# Patient Record
Sex: Male | Born: 1940 | Race: White | Hispanic: No | Marital: Married | Smoking: Former smoker
Health system: Southern US, Community
[De-identification: ages and names within clinical notes are randomized; demographics above are authoritative.]

## PROBLEM LIST (undated history)

## (undated) DIAGNOSIS — C801 Malignant (primary) neoplasm, unspecified: Secondary | ICD-10-CM

## (undated) DIAGNOSIS — E079 Disorder of thyroid, unspecified: Secondary | ICD-10-CM

## (undated) DIAGNOSIS — M1990 Unspecified osteoarthritis, unspecified site: Secondary | ICD-10-CM

## (undated) DIAGNOSIS — E119 Type 2 diabetes mellitus without complications: Secondary | ICD-10-CM

## (undated) DIAGNOSIS — N289 Disorder of kidney and ureter, unspecified: Secondary | ICD-10-CM

## (undated) DIAGNOSIS — I10 Essential (primary) hypertension: Secondary | ICD-10-CM

## (undated) DIAGNOSIS — I2510 Atherosclerotic heart disease of native coronary artery without angina pectoris: Secondary | ICD-10-CM

## (undated) HISTORY — PX: OTHER SURGICAL HISTORY: SHX169

---

## 2011-12-23 HISTORY — PX: BYPASS GRAFT: SHX909

## 2011-12-23 HISTORY — PX: PACEMAKER INSERTION: SHX728

## 2017-03-15 ENCOUNTER — Emergency Department (HOSPITAL_COMMUNITY): Payer: Medicare Other

## 2017-03-15 ENCOUNTER — Encounter (HOSPITAL_COMMUNITY): Payer: Self-pay

## 2017-03-15 ENCOUNTER — Inpatient Hospital Stay (HOSPITAL_COMMUNITY)
Admission: EM | Admit: 2017-03-15 | Discharge: 2017-03-19 | DRG: 280 | Disposition: A | Discharge status: Home / Self Care | Payer: Medicare Other | Attending: Family Medicine | Admitting: Family Medicine

## 2017-03-15 DIAGNOSIS — I2510 Atherosclerotic heart disease of native coronary artery without angina pectoris: Secondary | ICD-10-CM | POA: Diagnosis present

## 2017-03-15 DIAGNOSIS — E039 Hypothyroidism, unspecified: Secondary | ICD-10-CM | POA: Diagnosis present

## 2017-03-15 DIAGNOSIS — I509 Heart failure, unspecified: Secondary | ICD-10-CM

## 2017-03-15 DIAGNOSIS — C801 Malignant (primary) neoplasm, unspecified: Secondary | ICD-10-CM

## 2017-03-15 DIAGNOSIS — I214 Non-ST elevation (NSTEMI) myocardial infarction: Secondary | ICD-10-CM | POA: Diagnosis present

## 2017-03-15 DIAGNOSIS — I110 Hypertensive heart disease with heart failure: Secondary | ICD-10-CM | POA: Diagnosis present

## 2017-03-15 DIAGNOSIS — E278 Other specified disorders of adrenal gland: Secondary | ICD-10-CM | POA: Diagnosis present

## 2017-03-15 DIAGNOSIS — N179 Acute kidney failure, unspecified: Secondary | ICD-10-CM | POA: Diagnosis present

## 2017-03-15 DIAGNOSIS — E119 Type 2 diabetes mellitus without complications: Secondary | ICD-10-CM | POA: Diagnosis present

## 2017-03-15 DIAGNOSIS — Z79899 Other long term (current) drug therapy: Secondary | ICD-10-CM | POA: Diagnosis not present

## 2017-03-15 DIAGNOSIS — Z8249 Family history of ischemic heart disease and other diseases of the circulatory system: Secondary | ICD-10-CM

## 2017-03-15 DIAGNOSIS — Z905 Acquired absence of kidney: Secondary | ICD-10-CM | POA: Diagnosis not present

## 2017-03-15 DIAGNOSIS — Z85528 Personal history of other malignant neoplasm of kidney: Secondary | ICD-10-CM | POA: Diagnosis not present

## 2017-03-15 DIAGNOSIS — D72829 Elevated white blood cell count, unspecified: Secondary | ICD-10-CM | POA: Diagnosis present

## 2017-03-15 DIAGNOSIS — Z7982 Long term (current) use of aspirin: Secondary | ICD-10-CM | POA: Diagnosis not present

## 2017-03-15 DIAGNOSIS — I5031 Acute diastolic (congestive) heart failure: Secondary | ICD-10-CM | POA: Diagnosis present

## 2017-03-15 DIAGNOSIS — R0600 Dyspnea, unspecified: Secondary | ICD-10-CM | POA: Diagnosis not present

## 2017-03-15 DIAGNOSIS — Z950 Presence of cardiac pacemaker: Secondary | ICD-10-CM

## 2017-03-15 DIAGNOSIS — I160 Hypertensive urgency: Secondary | ICD-10-CM | POA: Diagnosis present

## 2017-03-15 DIAGNOSIS — E785 Hyperlipidemia, unspecified: Secondary | ICD-10-CM | POA: Diagnosis present

## 2017-03-15 DIAGNOSIS — Z87891 Personal history of nicotine dependence: Secondary | ICD-10-CM

## 2017-03-15 DIAGNOSIS — C7801 Secondary malignant neoplasm of right lung: Secondary | ICD-10-CM | POA: Diagnosis present

## 2017-03-15 DIAGNOSIS — R911 Solitary pulmonary nodule: Secondary | ICD-10-CM | POA: Diagnosis not present

## 2017-03-15 DIAGNOSIS — Z7902 Long term (current) use of antithrombotics/antiplatelets: Secondary | ICD-10-CM

## 2017-03-15 DIAGNOSIS — R0602 Shortness of breath: Secondary | ICD-10-CM | POA: Diagnosis present

## 2017-03-15 DIAGNOSIS — Z951 Presence of aortocoronary bypass graft: Secondary | ICD-10-CM | POA: Diagnosis not present

## 2017-03-15 DIAGNOSIS — G4733 Obstructive sleep apnea (adult) (pediatric): Secondary | ICD-10-CM | POA: Diagnosis present

## 2017-03-15 DIAGNOSIS — Z794 Long term (current) use of insulin: Secondary | ICD-10-CM

## 2017-03-15 DIAGNOSIS — I252 Old myocardial infarction: Secondary | ICD-10-CM

## 2017-03-15 DIAGNOSIS — I25110 Atherosclerotic heart disease of native coronary artery with unstable angina pectoris: Secondary | ICD-10-CM | POA: Diagnosis present

## 2017-03-15 DIAGNOSIS — Z833 Family history of diabetes mellitus: Secondary | ICD-10-CM

## 2017-03-15 DIAGNOSIS — E279 Disorder of adrenal gland, unspecified: Secondary | ICD-10-CM

## 2017-03-15 DIAGNOSIS — R590 Localized enlarged lymph nodes: Secondary | ICD-10-CM | POA: Diagnosis present

## 2017-03-15 HISTORY — DX: Disorder of thyroid, unspecified: E07.9

## 2017-03-15 HISTORY — DX: Atherosclerotic heart disease of native coronary artery without angina pectoris: I25.10

## 2017-03-15 HISTORY — DX: Type 2 diabetes mellitus without complications: E11.9

## 2017-03-15 HISTORY — DX: Unspecified osteoarthritis, unspecified site: M19.90

## 2017-03-15 HISTORY — DX: Essential (primary) hypertension: I10

## 2017-03-15 HISTORY — DX: Malignant (primary) neoplasm, unspecified: C80.1

## 2017-03-15 HISTORY — DX: Disorder of kidney and ureter, unspecified: N28.9

## 2017-03-15 LAB — CBC WITH DIFFERENTIAL/PLATELET
BASOS PCT: 0 %
Basophils Absolute: 0.1 10*3/uL (ref 0.0–0.1)
EOS ABS: 0.2 10*3/uL (ref 0.0–0.7)
EOS PCT: 1 %
HCT: 48.8 % (ref 39.0–52.0)
HEMOGLOBIN: 16.5 g/dL (ref 13.0–17.0)
LYMPHS PCT: 7 %
Lymphs Abs: 1 10*3/uL (ref 0.7–4.0)
MCH: 28.7 pg (ref 26.0–34.0)
MCHC: 33.8 g/dL (ref 30.0–36.0)
MCV: 85 fL (ref 78.0–100.0)
MONO ABS: 0.8 10*3/uL (ref 0.1–1.0)
MONOS PCT: 5 %
NEUTROS ABS: 13.7 10*3/uL — AB (ref 1.7–7.7)
Neutrophils Relative %: 87 %
Platelets: 121 10*3/uL — ABNORMAL LOW (ref 150–400)
RBC: 5.74 MIL/uL (ref 4.22–5.81)
RDW: 13.9 % (ref 11.5–15.5)
WBC: 15.8 10*3/uL — ABNORMAL HIGH (ref 4.0–10.5)

## 2017-03-15 LAB — BASIC METABOLIC PANEL
Anion gap: 11 (ref 5–15)
BUN: 12 mg/dL (ref 6–20)
CALCIUM: 9.3 mg/dL (ref 8.9–10.3)
CHLORIDE: 104 mmol/L (ref 101–111)
CO2: 25 mmol/L (ref 22–32)
CREATININE: 1.22 mg/dL (ref 0.61–1.24)
GFR calc non Af Amer: 57 mL/min — ABNORMAL LOW (ref >60)
Glucose, Bld: 259 mg/dL — ABNORMAL HIGH (ref 65–99)
Potassium: 4 mmol/L (ref 3.5–5.1)
SODIUM: 140 mmol/L (ref 135–145)

## 2017-03-15 LAB — TSH: TSH: 0.77 u[IU]/mL (ref 0.350–4.500)

## 2017-03-15 LAB — GLUCOSE, CAPILLARY
GLUCOSE-CAPILLARY: 407 mg/dL — AB (ref 65–99)
Glucose-Capillary: 324 mg/dL — ABNORMAL HIGH (ref 65–99)

## 2017-03-15 LAB — LIPID PANEL
CHOLESTEROL: 147 mg/dL (ref 0–200)
HDL: 42 mg/dL (ref >40)
LDL Cholesterol: 86 mg/dL (ref 0–99)
Total CHOL/HDL Ratio: 3.5 RATIO
Triglycerides: 93 mg/dL (ref <150)
VLDL: 19 mg/dL (ref 0–40)

## 2017-03-15 LAB — TROPONIN I
Troponin I: 0.78 ng/mL (ref <0.03)
Troponin I: 0.89 ng/mL (ref <0.03)
Troponin I: 0.76 ng/mL (ref <0.03)

## 2017-03-15 LAB — BRAIN NATRIURETIC PEPTIDE: B NATRIURETIC PEPTIDE 5: 677.2 pg/mL — AB (ref 0.0–100.0)

## 2017-03-15 MED ORDER — GI COCKTAIL ~~LOC~~: 30.0000 mL | Freq: Four times a day (QID) | ORAL | Status: DC | PRN

## 2017-03-15 MED ORDER — FUROSEMIDE 10 MG/ML IJ SOLN
20.0000 mg | Freq: Two times a day (BID) | INTRAMUSCULAR | Status: DC
  Administered 2017-03-15 – 2017-03-17 (×4): 20 mg via INTRAVENOUS
  Filled 2017-03-15 (×5): qty 2

## 2017-03-15 MED ORDER — ONDANSETRON HCL 4 MG/2ML IJ SOLN: 4.0000 mg | Freq: Four times a day (QID) | INTRAMUSCULAR | Status: DC | PRN

## 2017-03-15 MED ORDER — CARVEDILOL 25 MG PO TABS
25.0000 mg | ORAL_TABLET | Freq: Two times a day (BID) | ORAL | Status: DC
  Administered 2017-03-15 – 2017-03-19 (×8): 25 mg via ORAL
  Filled 2017-03-15 (×8): qty 1

## 2017-03-15 MED ORDER — CARVEDILOL 25 MG PO TABS: 25.0000 mg | ORAL_TABLET | Freq: Two times a day (BID) | ORAL | Status: DC

## 2017-03-15 MED ORDER — HEPARIN BOLUS VIA INFUSION
4000.0000 [IU] | Freq: Once | INTRAVENOUS | Status: AC
  Administered 2017-03-15: 4000 [IU] via INTRAVENOUS
  Filled 2017-03-15: qty 4000

## 2017-03-15 MED ORDER — IPRATROPIUM BROMIDE 0.02 % IN SOLN
0.5000 mg | Freq: Once | RESPIRATORY_TRACT | Status: AC
  Administered 2017-03-15: 0.5 mg via RESPIRATORY_TRACT
  Filled 2017-03-15: qty 2.5

## 2017-03-15 MED ORDER — HEPARIN BOLUS VIA INFUSION
4000.0000 [IU] | Freq: Once | INTRAVENOUS | Status: DC
  Filled 2017-03-15: qty 4000

## 2017-03-15 MED ORDER — NITROGLYCERIN IN D5W 200-5 MCG/ML-% IV SOLN
0.0000 ug/min | Freq: Once | INTRAVENOUS | Status: AC
Start: 2017-03-15 — End: 2017-03-15
  Administered 2017-03-15: 5 ug/min via INTRAVENOUS
  Filled 2017-03-15: qty 250

## 2017-03-15 MED ORDER — HYDRALAZINE HCL 20 MG/ML IJ SOLN: 5.0000 mg | Freq: Once | INTRAMUSCULAR | Status: DC

## 2017-03-15 MED ORDER — INSULIN ASPART 100 UNIT/ML ~~LOC~~ SOLN
0.0000 [IU] | Freq: Three times a day (TID) | SUBCUTANEOUS | Status: DC
  Administered 2017-03-16: 11 [IU] via SUBCUTANEOUS
  Administered 2017-03-16: 15 [IU] via SUBCUTANEOUS
  Administered 2017-03-17 (×3): 5 [IU] via SUBCUTANEOUS
  Administered 2017-03-18: 3 [IU] via SUBCUTANEOUS
  Administered 2017-03-18: 8 [IU] via SUBCUTANEOUS
  Administered 2017-03-18 – 2017-03-19 (×3): 5 [IU] via SUBCUTANEOUS

## 2017-03-15 MED ORDER — AMLODIPINE BESYLATE 5 MG PO TABS: 10.0000 mg | ORAL_TABLET | Freq: Every day | ORAL | Status: DC

## 2017-03-15 MED ORDER — ALBUTEROL SULFATE (2.5 MG/3ML) 0.083% IN NEBU
5.0000 mg | INHALATION_SOLUTION | Freq: Once | RESPIRATORY_TRACT | Status: AC
  Administered 2017-03-15: 5 mg via RESPIRATORY_TRACT

## 2017-03-15 MED ORDER — HEPARIN (PORCINE) IN NACL 100-0.45 UNIT/ML-% IJ SOLN
1050.0000 [IU]/h | INTRAMUSCULAR | Status: DC
  Filled 2017-03-15: qty 250

## 2017-03-15 MED ORDER — LEVOTHYROXINE SODIUM 100 MCG PO TABS
100.0000 ug | ORAL_TABLET | Freq: Every day | ORAL | Status: DC
  Administered 2017-03-15 – 2017-03-19 (×5): 100 ug via ORAL
  Filled 2017-03-15 (×6): qty 1

## 2017-03-15 MED ORDER — AMLODIPINE BESYLATE 5 MG PO TABS
10.0000 mg | ORAL_TABLET | Freq: Every day | ORAL | Status: DC
  Administered 2017-03-15 – 2017-03-16 (×2): 10 mg via ORAL
  Filled 2017-03-15 (×2): qty 2

## 2017-03-15 MED ORDER — ASPIRIN EC 81 MG PO TBEC
81.0000 mg | DELAYED_RELEASE_TABLET | Freq: Every day | ORAL | Status: DC
  Administered 2017-03-16 – 2017-03-18 (×3): 81 mg via ORAL
  Filled 2017-03-15 (×4): qty 1

## 2017-03-15 MED ORDER — ALBUTEROL SULFATE (2.5 MG/3ML) 0.083% IN NEBU
INHALATION_SOLUTION | RESPIRATORY_TRACT | Status: AC
  Filled 2017-03-15: qty 6

## 2017-03-15 MED ORDER — ALBUTEROL (5 MG/ML) CONTINUOUS INHALATION SOLN
10.0000 mg/h | INHALATION_SOLUTION | Freq: Once | RESPIRATORY_TRACT | Status: DC
  Filled 2017-03-15: qty 20

## 2017-03-15 MED ORDER — CLONIDINE HCL 0.1 MG PO TABS
0.1000 mg | ORAL_TABLET | Freq: Every day | ORAL | Status: DC
  Administered 2017-03-15 – 2017-03-16 (×2): 0.1 mg via ORAL
  Filled 2017-03-15 (×2): qty 1

## 2017-03-15 MED ORDER — ACETAMINOPHEN 325 MG PO TABS: 650.0000 mg | ORAL_TABLET | ORAL | Status: DC | PRN

## 2017-03-15 MED ORDER — HEPARIN (PORCINE) IN NACL 100-0.45 UNIT/ML-% IJ SOLN
1050.0000 [IU]/h | INTRAMUSCULAR | Status: DC
  Administered 2017-03-15: 1050 [IU]/h via INTRAVENOUS
  Filled 2017-03-15 (×2): qty 250

## 2017-03-15 MED ORDER — INSULIN ASPART 100 UNIT/ML ~~LOC~~ SOLN
0.0000 [IU] | SUBCUTANEOUS | Status: DC
  Administered 2017-03-15: 15 [IU] via SUBCUTANEOUS

## 2017-03-15 MED ORDER — SODIUM CHLORIDE 0.9 % IV SOLN
INTRAVENOUS | Status: DC
  Administered 2017-03-15: 20 mL/h via INTRAVENOUS
  Administered 2017-03-16: 14:00:00 via INTRAVENOUS

## 2017-03-15 MED ORDER — FUROSEMIDE 10 MG/ML IJ SOLN
40.0000 mg | Freq: Once | INTRAMUSCULAR | Status: AC
Start: 2017-03-15 — End: 2017-03-15
  Administered 2017-03-15: 40 mg via INTRAVENOUS
  Filled 2017-03-15: qty 4

## 2017-03-15 MED ORDER — METHYLPREDNISOLONE SODIUM SUCC 125 MG IJ SOLR
125.0000 mg | Freq: Once | INTRAMUSCULAR | Status: AC
  Administered 2017-03-15: 125 mg via INTRAVENOUS
  Filled 2017-03-15: qty 2

## 2017-03-15 MED ORDER — HEPARIN SODIUM (PORCINE) 5000 UNIT/ML IJ SOLN: 5000.0000 [IU] | Freq: Three times a day (TID) | INTRAMUSCULAR | Status: DC

## 2017-03-15 MED ORDER — INSULIN ASPART 100 UNIT/ML ~~LOC~~ SOLN
0.0000 [IU] | Freq: Three times a day (TID) | SUBCUTANEOUS | Status: DC
  Administered 2017-03-15: 11 [IU] via SUBCUTANEOUS

## 2017-03-15 MED ORDER — IOPAMIDOL (ISOVUE-370) INJECTION 76%
INTRAVENOUS | Status: AC
  Filled 2017-03-15: qty 100

## 2017-03-15 NOTE — Progress Notes (Signed)
Attending Brief Admission Note  Patient seen and examined around 3:30pm. Briefly, 76 y.o. male with history of CAD with prior CABGx4, hypertension, type 2 diabetes, OSA, hypothyroidism, nephrectomy for renal cell carcinoma presenting with acute onset of shortness of breath occurring this morning.  Labs significant for troponin elevation to 0.78. No prior EKG for comparison in our system, though does have QRS widening and some questionable ST changes. These were discussed by [facility] residents with STEMI team, who does not believe this to be STEMI. At time of my evaluation patient felt back to normal, on nitro drip. CXR with some pulmonary edema, also lung nodule. Exam shows well appearing male in NAD, some crackles in bases bilaterally, otherwise exam unremarkable.  Unclear if this is true ACS vs PE vs new onset CHF. Plan is to trend troponins, start IV heparin, get CTA chest to rule out PE & evaluate pulmonary nodule, also get echo. Dr. Meda Coffee of cardiology has kindly seen patient and agrees with this plan. Cardiology will follow, may end up performing cath this admission. Greatly appreciate cardiology recommendations.   Will cosign resident H&P when it is available.  Chrisandra Netters, MD [place]

## 2017-03-15 NOTE — ED Notes (Signed)
Troponin elevated at 0.78. Dr. Zenia Resides informed. No new verbal orders at this time.

## 2017-03-15 NOTE — ED Provider Notes (Addendum)
[facility] DEPT Provider Note   CSN: [id number] Arrival date & time: 03/15/17  2330     History   Chief Complaint Chief Complaint  Patient presents with  . Shortness of Breath    HPI Phillip Patel is a 76 y.o. male.  76 year old male with history of coronary artery disease presents with acute onset of shortness of breath with associated wheezing when he went to take a shower this morning. Denies any anginal type chest pain. No leg swelling or CHF symptoms. No fever or chills. No vomiting or diarrhea. Has been short of breath for the past several weeks but today was a lot worse. EMS was called and patient found to have increased blood pressure which he treated with his home meds. Patient placed on oxygen by EMS. Was transported here for treatment.      Past Medical History:  Diagnosis Date  . Arthritis   . Cancer ([place])    Kidney   . Coronary artery disease   . Diabetes mellitus without complication ([place])   . Hypertension   . Renal disorder    1 kidney, other removed for cancer  . Thyroid disease     There are no active problems to display for this patient.   Past Surgical History:  Procedure Laterality Date  . BYPASS GRAFT  2013   quadrupal bypass  . nephrectomy     . PACEMAKER INSERTION  2013       Home Medications    Prior to Admission medications   Not on File    Family History No family history on file.  Social History Social History  Substance Use Topics  . Smoking status: Former Research scientist (life sciences)  . Smokeless tobacco: Never Used  . Alcohol use Yes     Allergies   Patient has no allergy information on record.   Review of Systems Review of Systems  All other systems reviewed and are negative.    Physical Exam Updated Vital Signs BP (!) 233/101   Pulse 89   Temp 98.4 F (36.9 C) (Oral)   Resp (!) 23   Ht 5\' 6"  (1.676 m)   Wt 77.1 kg   SpO2 100% Comment: 2L O2  BMI 27.44 kg/m   Physical Exam  Constitutional: He is oriented to  person, place, and time. He appears well-developed and well-nourished.  Non-toxic appearance. No distress.  HENT:  Head: Normocephalic and atraumatic.  Eyes: Conjunctivae, EOM and lids are normal. Pupils are equal, round, and reactive to light.  Neck: Normal range of motion. Neck supple. No tracheal deviation present. No thyroid mass present.  Cardiovascular: Normal rate, regular rhythm and normal heart sounds.  Exam reveals no gallop.   No murmur heard. Pulmonary/Chest: Effort normal. No stridor. No respiratory distress. He has decreased breath sounds in the right lower field and the left lower field. He has wheezes in the right lower field and the left lower field. He has no rhonchi. He has no rales.  Abdominal: Soft. Normal appearance and bowel sounds are normal. He exhibits no distension. There is no tenderness. There is no rebound and no CVA tenderness.  Musculoskeletal: Normal range of motion. He exhibits no edema or tenderness.  Neurological: He is alert and oriented to person, place, and time. He has normal strength. No cranial nerve deficit or sensory deficit. GCS eye subscore is 4. GCS verbal subscore is 5. GCS motor subscore is 6.  Skin: Skin is warm and dry. No abrasion and no rash noted.  Psychiatric:  He has a normal mood and affect. His speech is normal and behavior is normal.  Nursing note and vitals reviewed.    ED Treatments / Results  Labs (all labs ordered are listed, but only abnormal results are displayed) Labs Reviewed  CBC WITH DIFFERENTIAL/PLATELET  BASIC METABOLIC PANEL  BRAIN NATRIURETIC PEPTIDE  TROPONIN I    EKG  EKG Interpretation  Date/Time:  Sunday March 15 2017 10:11:14 EDT Ventricular Rate:  88 PR Interval:    QRS Duration: 138 QT Interval:  401 QTC Calculation: 486 R Axis:   131 Text Interpretation:  Sinus rhythm Nonspecific intraventricular conduction delay Anterolateral infarct, acute (LAD) Confirmed by Carliss Porcaro  MD, Misty Rago ([id number]) on 03/15/2017  10:23:08 AM       Radiology No results found.  Procedures Procedures (including critical care time)  Medications Ordered in ED Medications  0.9 %  sodium chloride infusion (not administered)     Initial Impression / Assessment and Plan / ED Course  I have reviewed the triage vital signs and the nursing notes.  Pertinent labs & imaging results that were available during my care of the patient were reviewed by me and considered in my medical decision making (see chart for details).    Patient has no anginal type chest pain at this time. Troponin elevation noted. Patient had bronchospasm and was treated IV steroids and beta agonist.  chest x-ray consistent with CHF and patient given Lasix. Blood pressure noted and will start patient on IV nitroglycerin. Discuss with family practice resident who will come to admit the patient  CRITICAL CARE Performed by: Leota Jacobsen Total critical care time: 50 minutes Critical care time was exclusive of separately billable procedures and treating other patients. Critical care was necessary to treat or prevent imminent or life-threatening deterioration. Critical care was time spent personally by me on the following activities: development of treatment plan with patient and/or surrogate as well as nursing, discussions with consultants, evaluation of patient's response to treatment, examination of patient, obtaining history from patient or surrogate, ordering and performing treatments and interventions, ordering and review of laboratory studies, ordering and review of radiographic studies, pulse oximetry and re-evaluation of patient's condition.   Final Clinical Impressions(s) / ED Diagnoses   Final diagnoses:  SOB (shortness of breath)    New Prescriptions New Prescriptions   No medications on file     Lacretia Leigh, MD 03/15/17 1306    Lacretia Leigh, MD 03/15/17 1308

## 2017-03-15 NOTE — Consult Note (Addendum)
CARDIOLOGY CONSULT NOTE   Patient ID: Phillip Patel MRN: [id number], DOB/AGE: [birth year]   Admit date: 03/15/2017 Date of Consult: 03/15/2017  Primary Physician: Pcp Not In System Primary Cardiologist: A cardiologist at [facility], [place] Referring physician: Chrisandra Patel  Reason for consult:  SOB, troponin elevation  Problem List  Past Medical History:  Diagnosis Date  . Arthritis   . Cancer ([place])    Kidney   . Coronary artery disease   . Diabetes mellitus without complication ([place])   . Hypertension   . Renal disorder    1 kidney, other removed for cancer  . Thyroid disease     Past Surgical History:  Procedure Laterality Date  . BYPASS GRAFT  2013   quadrupal bypass  . nephrectomy     . PACEMAKER INSERTION  2013     Allergies  No Known Allergies  HPI   Phillip Patel is a 76 y.o. male presenting with an acute onset shortness of breath.  The patient has past medical history significant for CABG x4 2013, pacemaker (st jude), L kidney removal RCC 2007, HTN, diabetes, HLD, hypothyroidism. He states that he remains fairly active, and goes to the gym in his hospital 3 times a week with no symptoms of chest pain or shortness of breath, and has been compliant with his medications. His blood pressure has been recently poorly controlled and he has seen nephrologist approximately 3 weeks ago.  He is visiting here from [place], he denies any unusual activity, food intake, drugs or alcohol, however this morning when he walked out of shower he experienced sudden onset shortness of breath and called EMS. He didn't feel any chest pain. He didn't take his morning medications today as he was taken to the ER. Upon presentation he got sublingual nitroglycerin that eased up his pain. His blood pressure on presentation was 233/101 and he was started on IV nitroglycerin. He is currently chest pain-free, he also denies shortness of breath. He denies any orthopnea  or proximal nocturnal dyspnea at home. He denies any lower extremity edema. He has been compliant with his medications. He states that last week he had a subjective feeling of fever and chills approximately on Wednesday and he had cough that is now resolved.  Inpatient Medications  . amLODipine  10 mg Oral Daily  . [START ON 03/16/2017] aspirin EC  81 mg Oral Daily  . carvedilol  25 mg Oral BID WC  . cloNIDine  0.1 mg Oral Daily  . heparin  4,000 Units Intravenous Once  . insulin aspart  0-15 Units Subcutaneous TID WC  . iopamidol      . levothyroxine  100 mcg Oral QAC breakfast   . sodium chloride 20 mL/hr (03/15/17 1046)  . heparin     Family History No family history on file.   Social History Social History   Social History  . Marital status: Married    Spouse name: N/A  . Number of children: N/A  . Years of education: N/A   Occupational History  . Not on file.   Social History Main Topics  . Smoking status: Former Research scientist (life sciences)  . Smokeless tobacco: Never Used  . Alcohol use Yes  . Drug use: Unknown  . Sexual activity: Not on file   Other Topics Concern  . Not on file   Social History Narrative  . No narrative on file     Review of Systems  General:  No chills, fever, night sweats  or weight changes.  Cardiovascular:  No chest pain, dyspnea on exertion, edema, orthopnea, palpitations, paroxysmal nocturnal dyspnea. Dermatological: No rash, lesions/masses Respiratory: No cough, dyspnea Urologic: No hematuria, dysuria Abdominal:   No nausea, vomiting, diarrhea, bright red blood per rectum, melena, or hematemesis Neurologic:  No visual changes, wkns, changes in mental status. All other systems reviewed and are otherwise negative except as noted above.  Physical Exam  Blood pressure (!) 171/74, pulse 85, temperature 98.4 F (36.9 C), temperature source Oral, resp. rate 18, height 5\' 6"  (1.676 m), weight 170 lb (77.1 kg), SpO2 95 %.  General: Pleasant, NAD Psych:  Normal affect. Neuro: Alert and oriented X 3. Moves all extremities spontaneously. HEENT: Normal  Neck: Supple without bruits or JVD. Lungs:  Resp regular and unlabored, crackles L>R Heart: RRR no s3, s4, 2/6 systolic murmur. Abdomen: Soft, non-tender, non-distended, BS + x 4.  Extremities: No clubbing, cyanosis or edema. DP/PT/Radials 2+ and equal bilaterally.  Labs  Recent Labs  03/15/17 1034  TROPONINI 0.78*   Lab Results  Component Value Date   WBC 15.8 (H) 03/15/2017   HGB 16.5 03/15/2017   HCT 48.8 03/15/2017   MCV 85.0 03/15/2017   PLT 121 (L) 03/15/2017     Recent Labs Lab 03/15/17 1034  NA 140  K 4.0  CL 104  CO2 25  BUN 12  CREATININE 1.22  CALCIUM 9.3  GLUCOSE 259*   Radiology/Studies  Dg Chest 2 View  Result Date: 03/15/2017 CLINICAL DATA:  Patient with history of bypass surgery. Shortness of breath. EXAM: CHEST  2 VIEW COMPARISON:  None. FINDINGS: Multi lead pacer apparatus overlies the left hemithorax. Patient status post median sternotomy. Aortic atherosclerosis. No large area of pulmonary consolidation. Perihilar interstitial opacities. Biapical pleuroparenchymal thickening. There is a 1.9 cm nodule within the right mid lung. Thoracic spine degenerative changes. IMPRESSION: 1.9 cm nodule within the right mid lung. This needs dedicated evaluation with chest CT in the non acute setting. Cardiac contours upper limits of normal. Perihilar interstitial opacities. A component of pulmonary edema is not excluded. Aortic atherosclerosis. Electronically Signed   By: Lovey Newcomer M.D.   On: 03/15/2017 10:36   Echocardiogram - none  ECG: SR, deep Q waves in the anterolateral leads     ASSESSMENT AND PLAN  Patient Active Problem List   Diagnosis Date Noted  . Shortness of breath 03/15/2017  . Non-ST elevation (NSTEMI) myocardial infarction ([place]) 03/15/2017  . Hypertensive urgency, malignant 03/15/2017  . Hx of CABG 03/15/2017  . Lung nodule 03/15/2017    The patient presents with SOB, elevated troponin 0.7, ECG shows SR, deep Q waves in the anterolateral leads, consistent with a prior  he is currently chest pain free, the differential includes NSTEMI, pulmonary embolism, he also has evidence of CHF and a pulmonary nodule on chest CT.  Plan: - discussed with STEMI team, they believe that ECG shows an old anterior MI - we obtained ECG from [facility] in [facility], that shows SR, deep Q waves in the anterolateral leads, but more narrow QRS 94 ms - CT chest to rule out PE and further characterize lung nodule  - restart home BP meds - carvedilol 25 mg po BID and clonidine 0.1 mg po daily, amlodipine was added, I agree - cycle troponin, decide about potential cath based on the above results and troponin trend - order echocardiogram - start low dose lasix 20 mg iv BID, follow crea closely given h/o nephrectomy  - start iv  heparin  Signed, Ena Dawley, MD, [facility] 03/15/2017, 4:03 PM

## 2017-03-15 NOTE — ED Triage Notes (Addendum)
Patient has been experiencing SOB the past few weeks but felt it was worse this AM with exertion. Hy. Of quadrupl bypass and pacemaker placement. Systolic was in the 561'B with EMS. No headaches, vision changes, or epitaxis. No CP, or N/V. Small expiratory wheezes noted bilaterally. Currently on 2L O2 and sats 100%.

## 2017-03-15 NOTE — H&P (Signed)
[facility] Admission History and Physical Service Pager: [phone]  Patient name: Phillip Patel Medical record number: [id number] Date of birth: 01-22-1941 Age: 76 y.o. Gender: male  Primary Care Provider: Pcp Not In System Consultants: Cardiology Code Status: Full code  Chief Complaint: shortness of breath  Assessment and Plan: [name] is a 76 y.o. male presenting with acute shortness of breath without chest pain, lower extremity swelling or pain. PMH is significant for CABG  2013, pacemaker (st jude), L kidney removal RCC 2007, HTN, diabetes, HLD, hypothyroidism, sleep apnea   Acute SOB: Acute shortness of breath without associated chest pain, although noted that shortness of breath was worse with exertion and did have some diaphoresis the night prior. Given history of diabetes, this could be an atypical presentation of ACS. Pt has significant CAD history with CABG in 2013 and reportedly felt similar to this presentation. EKG showed LVH and first troponin elevated to 0.78. Will need ACS rule out. No history of heart failure to date, but presented with BNP elevated to 677 and has bibasilar crackles on exam with no LE edema, highly suggestive of acute heart failure. Given lasix 40mg  IV lasix.  Denies fevers, chills or productive sputum and has CXR showing no infiltrates, making PNA unlikely. CXR did show 1.9 cm nodule within the right mid lung that needs CT to further identify. No LE edema, erythema or palpable cords on exam, but does have a wells score of 3 based on PE being the most likely diagnosis,therefore will get CTA.  Discussed case and EKG with on call Cardiologist Dr. Burt Knack who felt this was unlikely to be acute ACS. In ED was reportedly wheezing and was given solumedrol and duonebs with some relief and started on nitro drip for SOB and elevated blood pressure. Did have leukocytosis to 15 in ED with no additional s/sx of infection.  - Admit to  inpatient with telemetry under attending [facility] - Cardiology consulted;appreciate recommendations - Vitals per floor protocol - Continuous cardiac monitoring and pulse ox - Start heparin drip per pharmacy for possible NSTEMI and/or PE - CTA chest to evaluate for PE and 1.9 cm nodule - Lasix 20mg  IV BID - f/u echo - Trend troponins - Repeat EKG in AM - Titrate oxygen to greater than 92% - s/p ASA 325mg  ASA, cont 81mg  3/25 - AM CBC, BMP - Risk strat. Labs A1C, Lipid panel, TSH - duonebs PRN  History of hypertension with hypertensive emergency: Presented with BP to 233/101 and shortness of breath and denied any headaches, changes in vision or chest pain. Did have troponin elevated to 0.78 and BNP of 677. Patient is alert and oriented 3. Takes quinapril (unknown dose), coreg 25mg  BID and clonidine 0.1mg  at home. Took his clonidine last night and quinapril en route to [facility]. Did receive solumedrol in ED for reported bronchospasm and could be contributing to elevated blood pressures.  No focal neurological deficits on exam, making CVA very unlikely. Started on nitro drip for BP control in ED and given lasix 40mg  IV for SOB.  - Continue Coreg 25 mg twice a day - Continue clonidine 0.1 mg daily - Started Norvasc 10 mg daily - Discontinue nitro drip - Cardiology following; appreciate recommendations  History of CAD: CABG 4 vessels in 0962 complicated by bradycardia with [employer] Jude pacemaker (MRI non-compatible) and presented with acute shortness of breath without chest pain and EKG showing deep Q waves in the anterolateral leads consistent with a prior EKG. STEMI team  feels that EKG shows an old anterior MI. Troponin elevated to 0.78. Patient took aspirin 325 mg prior to coming to [facility] and received sublingual nitro x1.  Currently denying CP.  - Cardiology following; appreciate recommendations - f/u repeat EKG - Trending troponins - continuous cardiac monitoring - Continue heparin ggt  Diabetes:  Reports that sugars are usually in the 130s-140s. 259 on BMP upon arrival.  Takes novolin 10U BID.   - mSSI - f/u A1C  HLD:  Per patient, takes a statin. - Pharmacy team currently reconciling medication. Will need to follow up  Hypothyroidism: Takes levothyroxine 115mcg daily.  - cont levothyroxine 1105mcg daily - repeat TSH  History of RCC s/p L kidney removal:  In 2007 had total L nephrectomy for RCC and reportedly did not require any additional treatment.  Does have Cr to 1.22. Unclear what his baseline is.  - continue to monitor BMP  OSA  - CPAP QHS  FEN/GI: heart healthy diet Prophylaxis: heparin ggt  Disposition: admit to inpatient with telemetry under attending [place]. Ruling out ACS/PE and evaluating for likely acute heart failure  History of Present Illness:  Phillip Patel is a 76 y.o. male presenting after episode of acute SOB this morning after getting out of the shower. Denied any associated CP. Did note that SOB was worse with exertion. Has a significant cardiac history of 4-vessel CABG in 2013 and stated that this episode feels similar. Last week had one day of subjective fevers and chills and over the course of the week has had ongoing upper respiratory symptoms.  Denies any HA, changes in vision, productive cough, fevers, chills, palpitations or LE edema, erythema or pain. His wife gave him ASA 325mg  prior to his arrival to [facility] via ambulance.   ED course: Noted to have BP elevated to 233/101 and denied any HA, changes in vision or CP. EKG showing deep Q waves in anterolateral leads consistent with LVH. Troponin elevated to 0.78 and BNP elevated to 677. WBC to 15.8.   Review Of Systems: Per HPI   Review of Systems  All other systems reviewed and are negative.   Patient Active Problem List   Diagnosis Date Noted  . Shortness of breath 03/15/2017  . Non-ST elevation (NSTEMI) myocardial infarction ([place]) 03/15/2017  . Hypertensive urgency, malignant 03/15/2017  . Hx  of CABG 03/15/2017  . Lung nodule 03/15/2017    Past Medical History: Past Medical History:  Diagnosis Date  . Arthritis   . Cancer ([place])    Kidney   . Coronary artery disease   . Diabetes mellitus without complication ([place])   . Hypertension   . Renal disorder    1 kidney, other removed for cancer  . Thyroid disease     Past Surgical History: Past Surgical History:  Procedure Laterality Date  . BYPASS GRAFT  2013   quadrupal bypass  . nephrectomy     . PACEMAKER INSERTION  2013    Social History: Social History  Substance Use Topics  . Smoking status: Former Research scientist (life sciences)  . Smokeless tobacco: Never Used  . Alcohol use Yes    Family History: No family history on file. Mother: HTN, T2DM  Allergies and Medications: No Known Allergies No current facility-administered medications on file prior to encounter.    No current outpatient prescriptions on file prior to encounter.    Objective: BP (!) 181/82 (BP Location: Right Arm)   Pulse 87   Temp 98.3 F (36.8 C) (Oral)   Resp 18  Ht 5\' 6"  (1.676 m)   Wt 166 lb (75.3 kg) Comment: scale a  SpO2 94%   BMI 26.79 kg/m  Exam: General: 76 year old male sitting up in bed appearing comfortable with nasal cannula in place on 1 L Eyes: EOMI, PERRL, non-injected ENTM: Oropharynx clear,MMM Neck: supple, no LAD Cardiovascular: RRR, no MRG, 2+ palpable radial pulses Respiratory: Gratis in place and NWOB, mild bibasilar crackles, no wheezing or rhonchi,  Gastrointestinal: obese abdomen with oblique scar on LUQ, soft NTND and no palpable masses. 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  Kerrin Mo, MD, PGY-2 [facility]

## 2017-03-15 NOTE — Progress Notes (Signed)
Patient's CBG 407. MD Paged. Order to give 15 units of novolog. Will continue to monitor patient.

## 2017-03-15 NOTE — Progress Notes (Signed)
Paged MD regarding nitro drip. Dr. Rosalyn Gess gave verbal order to stop Nitro.

## 2017-03-15 NOTE — Progress Notes (Signed)
ANTICOAGULATION CONSULT NOTE - Initial Consult  Pharmacy Consult for Heparin Indication: chest pain/ACS  No Known Allergies  Patient Measurements: Height: 5\' 6"  (167.6 cm) Weight: 170 lb (77.1 kg) IBW/kg (Calculated) : 63.8  Vital Signs: Temp: 98.4 F (36.9 C) (03/25 1004) Temp Source: Oral (03/25 1004) BP: 171/74 (03/25 1430) Pulse Rate: 85 (03/25 1430)  Labs:  Recent Labs  03/15/17 1034  HGB 16.5  HCT 48.8  PLT 121*  CREATININE 1.22  TROPONINI 0.78*    Estimated Creatinine Clearance: 51.9 mL/min (by C-G formula based on SCr of 1.22 mg/dL).   Medical History: Past Medical History:  Diagnosis Date  . Arthritis   . Cancer ([place])    Kidney   . Coronary artery disease   . Diabetes mellitus without complication ([place])   . Hypertension   . Renal disorder    1 kidney, other removed for cancer  . Thyroid disease     Medications:  No anticoagulants pta  Assessment: 75yom with elevated troponin to begin heparin. Baseline labs wnl.  Goal of Therapy:  Heparin level 0.3-0.7 units/ml Monitor platelets by anticoagulation protocol: Yes   Plan:  1) Heparin bolus 4000 units x 1 2) Heparin drip at 1050 units/hr 3) 8 hour heparin level 4) Daily heparin level and CBC  Deboraha Sprang 03/15/2017,3:03 PM

## 2017-03-16 ENCOUNTER — Inpatient Hospital Stay (HOSPITAL_COMMUNITY)
Admission: EM | Disposition: A | Discharge status: Home / Self Care | Payer: Self-pay | Source: Home / Self Care | Attending: Family Medicine

## 2017-03-16 ENCOUNTER — Inpatient Hospital Stay (HOSPITAL_COMMUNITY): Payer: Medicare Other

## 2017-03-16 DIAGNOSIS — I25110 Atherosclerotic heart disease of native coronary artery with unstable angina pectoris: Secondary | ICD-10-CM | POA: Diagnosis present

## 2017-03-16 DIAGNOSIS — R0600 Dyspnea, unspecified: Secondary | ICD-10-CM

## 2017-03-16 DIAGNOSIS — I509 Heart failure, unspecified: Secondary | ICD-10-CM

## 2017-03-16 DIAGNOSIS — I160 Hypertensive urgency: Secondary | ICD-10-CM

## 2017-03-16 HISTORY — PX: LEFT HEART CATH AND CORS/GRAFTS ANGIOGRAPHY: CATH118250

## 2017-03-16 LAB — TROPONIN I
Troponin I: 0.73 ng/mL (ref <0.03)
Troponin I: 0.69 ng/mL (ref <0.03)

## 2017-03-16 LAB — CBC
HCT: 44.3 % (ref 39.0–52.0)
Hemoglobin: 14.9 g/dL (ref 13.0–17.0)
MCH: 28.3 pg (ref 26.0–34.0)
MCHC: 33.6 g/dL (ref 30.0–36.0)
MCV: 84.1 fL (ref 78.0–100.0)
Platelets: 137 10*3/uL — ABNORMAL LOW (ref 150–400)
RBC: 5.27 MIL/uL (ref 4.22–5.81)
RDW: 13.8 % (ref 11.5–15.5)
WBC: 18.1 10*3/uL — AB (ref 4.0–10.5)

## 2017-03-16 LAB — COMPREHENSIVE METABOLIC PANEL
ALK PHOS: 74 U/L (ref 38–126)
ALT: 18 U/L (ref 17–63)
ANION GAP: 11 (ref 5–15)
AST: 19 U/L (ref 15–41)
Albumin: 3.6 g/dL (ref 3.5–5.0)
BUN: 26 mg/dL — ABNORMAL HIGH (ref 6–20)
CALCIUM: 9.1 mg/dL (ref 8.9–10.3)
CO2: 26 mmol/L (ref 22–32)
Chloride: 101 mmol/L (ref 101–111)
Creatinine, Ser: 1.5 mg/dL — ABNORMAL HIGH (ref 0.61–1.24)
GFR calc Af Amer: 51 mL/min — ABNORMAL LOW (ref >60)
GFR calc non Af Amer: 44 mL/min — ABNORMAL LOW (ref >60)
GLUCOSE: 332 mg/dL — AB (ref 65–99)
Potassium: 3.6 mmol/L (ref 3.5–5.1)
Sodium: 138 mmol/L (ref 135–145)
Total Bilirubin: 1.4 mg/dL — ABNORMAL HIGH (ref 0.3–1.2)
Total Protein: 6.7 g/dL (ref 6.5–8.1)

## 2017-03-16 LAB — ECHOCARDIOGRAM COMPLETE
AVLVOTPG: 3 mmHg
EWDT: 239 ms
FS: 23 % — AB (ref 28–44)
Height: 66 in
IV/PV OW: 0.75
LA diam index: 1.93 cm/m2
LA vol A4C: 50.3 ml
LA vol index: 30 mL/m2
LASIZE: 36 mm
LAVOL: 55.9 mL
LDCA: 2.54 cm2
LEFT ATRIUM END SYS DIAM: 36 mm
LV PW d: 12 mm — AB (ref 0.6–1.1)
LV TDI E'LATERAL: 7.07
LV TDI E'MEDIAL: 4.13
LV e' LATERAL: 7.07 cm/s
LVOT SV: 48 mL
LVOT VTI: 18.8 cm
LVOTD: 18 mm
LVOTPV: 84.9 cm/s
MV Dec: 239
MV pk E vel: 1.1 m/s
RV LATERAL S' VELOCITY: 9.36 cm/s
RV TAPSE: 14.3 mm
WEIGHTICAEL: 2596.8 [oz_av]

## 2017-03-16 LAB — HEMOGLOBIN A1C
Hgb A1c MFr Bld: 9.2 % — ABNORMAL HIGH (ref 4.8–5.6)
Mean Plasma Glucose: 217 mg/dL

## 2017-03-16 LAB — GLUCOSE, CAPILLARY
GLUCOSE-CAPILLARY: 178 mg/dL — AB (ref 65–99)
Glucose-Capillary: 320 mg/dL — ABNORMAL HIGH (ref 65–99)
Glucose-Capillary: 352 mg/dL — ABNORMAL HIGH (ref 65–99)
Glucose-Capillary: 203 mg/dL — ABNORMAL HIGH (ref 65–99)

## 2017-03-16 LAB — HEPARIN LEVEL (UNFRACTIONATED)
Heparin Unfractionated: 0.38 IU/mL (ref 0.30–0.70)
Heparin Unfractionated: 0.56 IU/mL (ref 0.30–0.70)

## 2017-03-16 LAB — PROTIME-INR
INR: 1.1
PROTHROMBIN TIME: 14.3 s (ref 11.4–15.2)

## 2017-03-16 LAB — D-DIMER, QUANTITATIVE (NOT AT ARMC): D DIMER QUANT: 0.41 ug{FEU}/mL (ref 0.00–0.50)

## 2017-03-16 SURGERY — LEFT HEART CATH AND CORS/GRAFTS ANGIOGRAPHY
Anesthesia: LOCAL

## 2017-03-16 MED ORDER — IOPAMIDOL (ISOVUE-370) INJECTION 76%
INTRAVENOUS | Status: AC
  Filled 2017-03-16: qty 125

## 2017-03-16 MED ORDER — SODIUM CHLORIDE 0.9% FLUSH
3.0000 mL | Freq: Two times a day (BID) | INTRAVENOUS | Status: DC
  Administered 2017-03-17 – 2017-03-18 (×4): 3 mL via INTRAVENOUS

## 2017-03-16 MED ORDER — LIDOCAINE HCL (PF) 1 % IJ SOLN
INTRAMUSCULAR | Status: AC
  Filled 2017-03-16: qty 30

## 2017-03-16 MED ORDER — FENTANYL CITRATE (PF) 100 MCG/2ML IJ SOLN
INTRAMUSCULAR | Status: DC | PRN
  Administered 2017-03-16: 25 ug via INTRAVENOUS

## 2017-03-16 MED ORDER — ONDANSETRON HCL 4 MG/2ML IJ SOLN: 4.0000 mg | Freq: Four times a day (QID) | INTRAMUSCULAR | Status: DC | PRN

## 2017-03-16 MED ORDER — HEPARIN SODIUM (PORCINE) 1000 UNIT/ML IJ SOLN
INTRAMUSCULAR | Status: DC | PRN
  Administered 2017-03-16: 4000 [IU] via INTRAVENOUS

## 2017-03-16 MED ORDER — IOPAMIDOL (ISOVUE-370) INJECTION 76%
INTRAVENOUS | Status: AC
  Filled 2017-03-16: qty 50

## 2017-03-16 MED ORDER — HEPARIN (PORCINE) IN NACL 100-0.45 UNIT/ML-% IJ SOLN
1150.0000 [IU]/h | INTRAMUSCULAR | Status: DC
  Administered 2017-03-17: 1050 [IU]/h via INTRAVENOUS
  Administered 2017-03-17: 1150 [IU]/h via INTRAVENOUS
  Filled 2017-03-16 (×2): qty 250

## 2017-03-16 MED ORDER — SODIUM CHLORIDE 0.9 % IV SOLN
INTRAVENOUS | Status: DC
  Administered 2017-03-16: 20:00:00 via INTRAVENOUS

## 2017-03-16 MED ORDER — MIDAZOLAM HCL 2 MG/2ML IJ SOLN
INTRAMUSCULAR | Status: AC
  Filled 2017-03-16: qty 2

## 2017-03-16 MED ORDER — ACETAMINOPHEN 325 MG PO TABS: 650.0000 mg | ORAL_TABLET | ORAL | Status: DC | PRN

## 2017-03-16 MED ORDER — CLONIDINE HCL 0.1 MG PO TABS
0.1000 mg | ORAL_TABLET | Freq: Two times a day (BID) | ORAL | Status: DC
  Administered 2017-03-16 – 2017-03-19 (×6): 0.1 mg via ORAL
  Filled 2017-03-16 (×6): qty 1

## 2017-03-16 MED ORDER — SODIUM CHLORIDE 0.9 % IV SOLN: 250.0000 mL | INTRAVENOUS | Status: DC | PRN

## 2017-03-16 MED ORDER — SODIUM CHLORIDE 0.9% FLUSH: 3.0000 mL | INTRAVENOUS | Status: DC | PRN

## 2017-03-16 MED ORDER — MIDAZOLAM HCL 2 MG/2ML IJ SOLN
INTRAMUSCULAR | Status: DC | PRN
  Administered 2017-03-16: 2 mg via INTRAVENOUS

## 2017-03-16 MED ORDER — SODIUM CHLORIDE 0.9% FLUSH
3.0000 mL | Freq: Two times a day (BID) | INTRAVENOUS | Status: DC
  Administered 2017-03-16: 3 mL via INTRAVENOUS

## 2017-03-16 MED ORDER — VERAPAMIL HCL 2.5 MG/ML IV SOLN
INTRAVENOUS | Status: DC | PRN
  Administered 2017-03-16: 18:00:00 via INTRA_ARTERIAL

## 2017-03-16 MED ORDER — HEPARIN (PORCINE) IN NACL 2-0.9 UNIT/ML-% IJ SOLN
INTRAMUSCULAR | Status: DC | PRN
  Administered 2017-03-16: 1000 mL via INTRA_ARTERIAL

## 2017-03-16 MED ORDER — HEPARIN SODIUM (PORCINE) 1000 UNIT/ML IJ SOLN
INTRAMUSCULAR | Status: AC
  Filled 2017-03-16: qty 1

## 2017-03-16 MED ORDER — SODIUM CHLORIDE 0.9 % IV SOLN
INTRAVENOUS | Status: DC
  Administered 2017-03-16: 10:00:00 via INTRAVENOUS

## 2017-03-16 MED ORDER — IOPAMIDOL (ISOVUE-370) INJECTION 76%
INTRAVENOUS | Status: DC | PRN
  Administered 2017-03-16: 190 mL via INTRA_ARTERIAL

## 2017-03-16 MED ORDER — FENTANYL CITRATE (PF) 100 MCG/2ML IJ SOLN
INTRAMUSCULAR | Status: AC
  Filled 2017-03-16: qty 2

## 2017-03-16 MED ORDER — VERAPAMIL HCL 2.5 MG/ML IV SOLN
INTRAVENOUS | Status: AC
  Filled 2017-03-16: qty 2

## 2017-03-16 MED ORDER — HEPARIN (PORCINE) IN NACL 2-0.9 UNIT/ML-% IJ SOLN
INTRAMUSCULAR | Status: AC
  Filled 2017-03-16: qty 1000

## 2017-03-16 MED ORDER — INSULIN GLARGINE 100 UNIT/ML ~~LOC~~ SOLN
10.0000 [IU] | Freq: Every day | SUBCUTANEOUS | Status: DC
  Administered 2017-03-16: 10 [IU] via SUBCUTANEOUS
  Filled 2017-03-16 (×3): qty 0.1

## 2017-03-16 SURGICAL SUPPLY — 15 items
CATH EXPO 5F MPA-1 (CATHETERS) ×2 IMPLANT
CATH INFINITI 5 FR IM (CATHETERS) ×2 IMPLANT
CATH INFINITI 5 FR LCB (CATHETERS) ×2 IMPLANT
CATH INFINITI 5 FR RCB (CATHETERS) ×2 IMPLANT
CATH INFINITI 5FR AL1 (CATHETERS) ×2 IMPLANT
CATH INFINITI 5FR MULTPACK ANG (CATHETERS) ×2 IMPLANT
DEVICE RAD COMP TR BAND LRG (VASCULAR PRODUCTS) ×2 IMPLANT
GLIDESHEATH SLEND SS 6F .021 (SHEATH) ×2 IMPLANT
GUIDEWIRE INQWIRE 1.5J.035X260 (WIRE) ×1 IMPLANT
INQWIRE 1.5J .035X260CM (WIRE) ×2
KIT HEART LEFT (KITS) ×2 IMPLANT
PACK CARDIAC CATHETERIZATION (CUSTOM PROCEDURE TRAY) ×2 IMPLANT
SYR MEDRAD MARK V 150ML (SYRINGE) ×2 IMPLANT
TRANSDUCER W/STOPCOCK (MISCELLANEOUS) ×2 IMPLANT
TUBING CIL FLEX 10 FLL-RA (TUBING) ×2 IMPLANT

## 2017-03-16 NOTE — Interval H&P Note (Signed)
Cath Lab Visit (complete for each Cath Lab visit)  Clinical Evaluation Leading to the Procedure:   ACS: Yes.    Non-ACS:    Anginal Classification: CCS IV  Anti-ischemic medical therapy: Minimal Therapy (1 class of medications)  Non-Invasive Test Results: No non-invasive testing performed  Prior CABG: Previous CABG      History and Physical Interval Note:  03/16/2017 5:53 PM  [name]  has presented today for surgery, with the diagnosis of n stemi  The various methods of treatment have been discussed with the patient and family. After consideration of risks, benefits and other options for treatment, the patient has consented to  Procedure(s): Left Heart Cath and Cors/Grafts Angiography (N/A) as a surgical intervention .  The patient's history has been reviewed, patient examined, no change in status, stable for surgery.  I have reviewed the patient's chart and labs.  Questions were answered to the patient's satisfaction.     Phillip Patel

## 2017-03-16 NOTE — Progress Notes (Signed)
Progress Note  Patient Name: Phillip Patel Date of Encounter: 03/16/2017   Primary Physician: Pcp Not In System Primary Cardiologist: A cardiologist at [facility], [place] Referring physician: Chrisandra Netters  Patient Profile     76 y.o. 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 He was visiting from [place], and awakened on the morning of March 25 rib sudden onset of shortness of breath and calling EMS. He denied having any chest pain, but was noted to have severe hypertension 233/10 1 mmHg on around ER via EMS. He was started on IV nitroglycerin and was given Solu-Medrol for wheezing.  Prior to this, he denied any orthopnea or PND at home. No edema. Indicated that he remain compliant with medications.  He has an abnormal EKG with deep Q waves in anterior leads suggestive of prior anterior MI with a ventricular conduction delay. Initially the STEMI doctor (Dr. Burt Knack) was consulted who felt that this was not consistent with acute STEMI.  He has subsequently ruled in with low troponin levels for possible non-STEMI versus demand ischemia in the setting of hypertensive urgency/accelerated malignant hypertension.  Started back on home medications with better blood pressure control. Started on IV heparin. IV nitroglycerin discontinued. IV Lasix administered. Amlodipine 10 mg started.  Subjective   Feels much better today. No further bleeding issues. No chest pain. Apparently in the past he may have "had a heart attack "years ago but never had an evaluation at that time. He had 2 heart catheterizations prior to the one leading to his CABG with nothing done.   Inpatient Medications    Scheduled Meds: . amLODipine  10 mg Oral Daily  . aspirin EC  81 mg Oral Daily  . carvedilol  25 mg Oral BID WC  . cloNIDine   0.1 mg Oral Daily  . furosemide  20 mg Intravenous BID  . insulin aspart  0-15 Units Subcutaneous TID WC  . levothyroxine  100 mcg Oral QAC breakfast   Continuous Infusions: . sodium chloride 20 mL/hr (03/15/17 1046)  . heparin 1,050 Units/hr (03/15/17 1644)   PRN Meds: acetaminophen, gi cocktail, ondansetron (ZOFRAN) IV   Vital Signs    Vitals:   03/15/17 1610 03/15/17 2124 03/16/17 0030 03/16/17 0645  BP: (!) 181/82 (!) 170/79 (!) 145/75 139/68  Pulse: 87 81 81 66  Resp: 18 18 18 18   Temp: 98.3 F (36.8 C) 98.4 F (36.9 C)  97.5 F (36.4 C)  TempSrc: Oral Oral  Oral  SpO2: 94% 90% 94% 96%  Weight: 75.3 kg (166 lb)   73.6 kg (162 lb 4.8 oz)  Height: 5\' 6"  (1.676 m)       Intake/Output Summary (Last 24 hours) at 03/16/17 0748 Last data filed at 03/16/17 0300  Gross per 24 hour  Intake              520 ml  Output                0 ml  Net              520 ml   Ins and outs accurately recorded answers 3 "urine occurrences "but not any amount recorded. Weight change would suggest significant diuresis.  Filed Weights   03/15/17 1006 03/15/17 1610 03/16/17 0645  Weight: 77.1 kg (170 lb) 75.3  kg (166 lb) 73.6 kg (162 lb 4.8 oz)    Telemetry    NSR - Personally Reviewed  ECG    No new EKG this morning  Physical Exam   General appearance: alert, cooperative, appears stated age, no distress and Otherwise healthy-appearing Neck: no adenopathy, no carotid bruit, supple, symmetrical, trachea midline and Mild JVD. 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 Lungs: clear to auscultation bilaterally and normal percussion bilaterally Heart: regular rate and rhythm, S1, S2 normal, no murmur, click, rub or gallop and normal apical impulse Abdomen: soft, non-tender; bowel sounds normal; no masses,  no organomegaly Extremities: extremities normal, atraumatic, no cyanosis or edema Pulses: 2+ and symmetric Neurologic: Grossly  normal   Labs    Chemistry Recent Labs Lab 03/15/17 1034  NA 140  K 4.0  CL 104  CO2 25  GLUCOSE 259*  BUN 12  CREATININE 1.22  CALCIUM 9.3  GFRNONAA 57*  GFRAA >60  ANIONGAP 11     Hematology Recent Labs Lab 03/15/17 1034  WBC 15.8*  RBC 5.74  HGB 16.5  HCT 48.8  MCV 85.0  MCH 28.7  MCHC 33.8  RDW 13.9  PLT 121*    Cardiac Enzymes Recent Labs Lab 03/15/17 1034 03/15/17 1554 03/15/17 1834 03/16/17 0031  TROPONINI 0.78* 0.89* 0.76* 0.73*   No results for input(s): TROPIPOC in the last 168 hours.   BNP Recent Labs Lab 03/15/17 1034  BNP 677.2*     DDimer No results for input(s): DDIMER in the last 168 hours.   Radiology    Dg Chest 2 View  Result Date: 03/15/2017 CLINICAL DATA:  Patient with history of bypass surgery. Shortness of breath. EXAM: CHEST  2 VIEW COMPARISON:  None. FINDINGS: Multi lead pacer apparatus overlies the left hemithorax. Patient status post median sternotomy. Aortic atherosclerosis. No large area of pulmonary consolidation. Perihilar interstitial opacities. Biapical pleuroparenchymal thickening. There is a 1.9 cm nodule within the right mid lung. Thoracic spine degenerative changes. IMPRESSION: 1.9 cm nodule within the right mid lung. This needs dedicated evaluation with chest CT in the non acute setting. Cardiac contours upper limits of normal. Perihilar interstitial opacities. A component of pulmonary edema is not excluded. Aortic atherosclerosis. Electronically Signed   By: Lovey Newcomer M.D.   On: 03/15/2017 10:36    Cardiac Studies   None yet  Assessment & Plan    Principal Problem:   Hypertensive urgency, malignant Active Problems:   Non-ST elevation (NSTEMI) myocardial infarction [facility])   Coronary artery disease involving native coronary artery of native heart with unstable angina pectoris ([place])   Acute heart failure ([place])  - unclear if combined vs. diastolic   Shortness of breath   Hx of CABG   Lung nodule    Difficult to tell what the true etiology of his presentation is whether this is related to hypertension versus non-STEMI. He has never had issues with uncontrolled hypertension and only missed 1 dose of medications. He did eat a burger the night prior to presentation, this is unusual for him either. At this point I think we do need to exclude non-STEMI/ACS. I would be more inclined to exclude ACS first prior to pulmonary embolus. Blood pressure notably improved and nitroglycerin infusion has been discontinued.  Continue IV heparin for now  We're trying to obtain his outside records to his CABG report to know his anatomy. - Would plan for cardiac catheterization this afternoon. (Okay to  give light breakfast)  Plans for catheterization today, would hold off on PE protocol CT or contrast.  He is on beta blocker and calcium channel blocker at stable doses for antianginal effect.  Will obtain 2-D echocardiogram to get a full assessment of his EF normal wall motion and diastolic function.  He clearly diuresed, despite not being adequately recorded - lungs are clear.    Signed, Glenetta Hew, MD  03/16/2017, 7:48 AM

## 2017-03-16 NOTE — Progress Notes (Signed)
  Echocardiogram 2D Echocardiogram has been performed.  Phillip Patel 03/16/2017, 9:28 AM

## 2017-03-16 NOTE — Progress Notes (Signed)
ANTICOAGULATION CONSULT NOTE - Follow Up Consult  Pharmacy Consult for heparin Indication: chest pain/ACS  No Known Allergies  Patient Measurements: Height: 5\' 6"  (167.6 cm) Weight: 162 lb 4.8 oz (73.6 kg) (a scale) IBW/kg (Calculated) : 63.8 Heparin Dosing Weight: 73.6 kg  Vital Signs: Temp: 97.7 F (36.5 C) (03/26 1939) Temp Source: Oral (03/26 1939) BP: 137/69 (03/26 1939) Pulse Rate: 61 (03/26 1939)  Labs:  Recent Labs  03/15/17 1034  03/15/17 1834 03/16/17 0031 03/16/17 0759 03/16/17 1408  HGB 16.5  --   --   --  14.9  --   HCT 48.8  --   --   --  44.3  --   PLT 121*  --   --   --  137*  --   LABPROT  --   --   --   --   --  14.3  INR  --   --   --   --   --  1.10  HEPARINUNFRC  --   --   --  0.38 0.56  --   CREATININE 1.22  --   --   --  1.50*  --   TROPONINI 0.78*  < > 0.76* 0.73* 0.69*  --   < > = values in this interval not displayed.  Estimated Creatinine Clearance: 39 mL/min (A) (by C-G formula based on SCr of 1.5 mg/dL (H)).   Assessment: 23 YOM with ACS started IV heparin last night, s/p cath this afternoon, d/t poor renal function will consider PCI to the circumflex at another time. Pharmacy is consulted to restart IV heparin 6 hrs after sheath removal (1908). Heparin was previously therapeutic on 1050 units/hr.  Goal of Therapy:  Heparin level 0.3-0.7 units/ml Monitor platelets by anticoagulation protocol: Yes   Plan:  Restart heparin 1050 units/hr with no bolus at 0100 f/u heparin level at 0900 f/u PCI plans  Maryanna Shape, PharmD, BCPS  Clinical Pharmacist  Pager: [phone]   03/16/2017,7:43 PM

## 2017-03-16 NOTE — Progress Notes (Signed)
Patient refuses CPAP for the night. RT will continue to monitor.  

## 2017-03-16 NOTE — Progress Notes (Signed)
Family Medicine Teaching Service Daily Progress Note Intern Pager: [phone]  Patient name: Phillip Patel Medical record number: [id number] Date of birth: [birth year] Age: 76 y.o. Gender: male  Primary Care Provider: Pcp Not In System Consultants: Cardiology Code Status: Full code  Pt Overview and Major Events to Date:  1. Admit to FMTS  Assessment and Plan: [place] Phillip Patel is a 76 y.o. male presenting with acute shortness of breath without chest pain, lower extremity swelling or pain. PMH is significant for CABG  2013, pacemaker (st jude), L kidney removal RCC 2007, HTN, diabetes, HLD, hypothyroidism, sleep apnea   Acute SOB, resolved: Acute shortness of breath without associated chest pain. Given history of diabetes, this could be an atypical presentation of ACS. Pt has significant CAD history with CABG in 2013 and reportedly felt similar to this presentation.  EKG showed LVH and troponins to 0.76>0.73>0.69.  Seen by Cardiology this morning 3/26 who are planning cath today to rule out NSTEMI. No history of heart failure to date, but presented with BNP elevated to 677 and has bibasilar crackles on exam with no LE edema. UOP not recorded overnight, but is apparently down 8 pounds since admission. CXR did show 1.9 cm nodule within the right mid lung that needs CT to further identify. No LE edema, erythema or palpable cords, but cannot rule out PE at this time. Will need to hold on CTA for now given cath today.  - Cardiology consulted;appreciate recommendations - Continuous cardiac monitoring and pulse ox - Start heparin drip per pharmacy for possible NSTEMI and/or PE - Cath today to assess for NSTEMI - d-dimer to possible r/o PE - Lasix 20mg  IV BID - Strict I/Os - f/u echo - Trend troponins - Titrate oxygen to greater than 92% - ASA 81mg  - Risk strat. Labs A1C - duonebs PRN  History of hypertension with hypertensive emergency, improved: Presented with BP to 233/101 and shortness of  breath and denied any headaches, changes in vision or chest pain. Takes quinapril (unknown dose), coreg 25mg  BID and clonidine 0.1mg  at home (wife says he is prescribed BID).  Already received norvasc dose today - Continue Coreg 25 mg twice a day - Increase clonidine 0.1 mg daily to BID tomorrow - Norvasc 10 mg today and will DC tomorrow - Cardiology following; appreciate recommendations  History of CAD: CABG 4 vessels in 1478 complicated by bradycardia with St. Jude pacemaker (MRI non-compatible). Denying CP currently.  - Cardiology following; appreciate recommendations - f/u repeat EKG - Trending troponins - continuous cardiac monitoring - Continue heparin ggt  Pulmonary nodule: Noted to have 1.9cm nodule on CXR. Do not have patient records and are unclear if this is new. Remote history of smoking. - CTA for further characterization of nodule, if d-dimer negative, will recommend outpatient CT  Diabetes: Reports that sugars are usually in the 130s-140s. CBGs in 300s with high of 400 overnight. Takes novolin 10U BID.   - Start 10U lantus in AM - cont mSSI - f/u A1C  HLD:  Per patient, takes a statin. - Pharmacy team currently reconciling medication. Will need to follow up  Hypothyroidism: Takes levothyroxine 149mcg daily. TSH WNL - cont levothyroxine 161mcg daily  History of RCC s/p L kidney removal:  In 2007 had total L nephrectomy for RCC and reportedly did not require any additional treatment. Creatinine to 1.2 upon admission and elevated to 1.5 now.  Unclear what his baseline is.  - continue to monitor BMP  Disposition: DC pending Cardiology work up  Subjective:  Feels well this morning, was ambulating back from bathroom without SOB, CP or palpitations.   Objective: Temp:  [97.5 F (36.4 C)-98.4 F (36.9 C)] 97.5 F (36.4 C) (03/26 0645) Pulse Rate:  [66-89] 66 (03/26 0645) Resp:  [16-26] 18 (03/26 0645) BP: (139-233)/(68-116) 139/68 (03/26 0645) SpO2:  [90 %-100  %] 96 % (03/26 0645) Weight:  [162 lb 4.8 oz (73.6 kg)-170 lb (77.1 kg)] 162 lb 4.8 oz (73.6 kg) (03/26 0645) Physical Exam: General: 76yo M sitting on side of bed in NAD Cardiovascular: RRR, no MRG, 2+ palpable pulses Respiratory: NWOB, CTABL, no wheezing or rhonchi Abdomen: soft, NTND Extremities: no lower extremity edema  Laboratory:  Recent Labs Lab 03/15/17 1034  WBC 15.8*  HGB 16.5  HCT 48.8  PLT 121*    Recent Labs Lab 03/15/17 1034  NA 140  K 4.0  CL 104  CO2 25  BUN 12  CREATININE 1.22  CALCIUM 9.3  GLUCOSE 259*    Imaging/Diagnostic Tests: Dg Chest 2 View  Result Date: 03/15/2017 CLINICAL DATA:  Patient with history of bypass surgery. Shortness of breath. EXAM: CHEST  2 VIEW COMPARISON:  None. FINDINGS: Multi lead pacer apparatus overlies the left hemithorax. Patient status post median sternotomy. Aortic atherosclerosis. No large area of pulmonary consolidation. Perihilar interstitial opacities. Biapical pleuroparenchymal thickening. There is a 1.9 cm nodule within the right mid lung. Thoracic spine degenerative changes. IMPRESSION: 1.9 cm nodule within the right mid lung. This needs dedicated evaluation with chest CT in the non acute setting. Cardiac contours upper limits of normal. Perihilar interstitial opacities. A component of pulmonary edema is not excluded. Aortic atherosclerosis. Electronically Signed   By: Lovey Newcomer M.D.   On: 03/15/2017 10:36    Eloise Levels, MD 03/16/2017, 7:05 AM PGY-1, [place] Intern pager: [phone], text pages welcome

## 2017-03-16 NOTE — Progress Notes (Signed)
ANTICOAGULATION CONSULT NOTE - Follow Up Consult  Pharmacy Consult for Heparin Indication: chest pain/ACS  No Known Allergies  Patient Measurements: Height: 5\' 6"  (167.6 cm) Weight: 162 lb 4.8 oz (73.6 kg) (a scale) IBW/kg (Calculated) : 63.8 Heparin Dosing Weight: 73.6 kg  Vital Signs: Temp: 97.5 F (36.4 C) (03/26 0645) Temp Source: Oral (03/26 0645) BP: 139/68 (03/26 0645) Pulse Rate: 66 (03/26 0645)  Labs:  Recent Labs  03/15/17 1034  03/15/17 1834 03/16/17 0031 03/16/17 0759  HGB 16.5  --   --   --  14.9  HCT 48.8  --   --   --  44.3  PLT 121*  --   --   --  137*  HEPARINUNFRC  --   --   --  0.38 0.56  CREATININE 1.22  --   --   --  1.50*  TROPONINI 0.78*  < > 0.76* 0.73* 0.69*  < > = values in this interval not displayed.  Estimated Creatinine Clearance: 39 mL/min (A) (by C-G formula based on SCr of 1.5 mg/dL (H)).  Assessment:  Heparin level remains therapeutic (0.56) on 1050 units/hr.  Obtaining outside records, for cardiac cath later today.  Goal of Therapy:  Heparin level 0.3-0.7 units/ml Monitor platelets by anticoagulation protocol: Yes   Plan:   Continue heparin drip at 1050 units/hr.  Daily heparin level and CBC while on heparin.  Follow up post-cath.  Arty Baumgartner, [place] Pager: [phone] 03/16/2017,10:07 AM

## 2017-03-16 NOTE — Discharge Summary (Signed)
[facility] Discharge Summary  Patient name: Phillip Patel Medical record number: [id number] Date of birth: 06/07/1941 Age: 76 y.o. 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 Initially the STEMI doctor (Dr. Burt Knack) was consulted who felt that this was not consistent with acute STEMI.  Subsequently wanted to perform cardiac catheterization to evaluate for non-STEMI versus demand ischemia in the setting of hypertensive urgency/accelerated malignant hypertension. Cardiac catheterization showed mid Cx lesion, 90 %stenosed. Ost 2nd Mrg to 2nd Mrg lesion, 70 %stenosed. The circumflex after this large OM has a 90% stenosis. Initially plan was to perform PCI with stenting and patient was started on Brillinta. On further discussion, given extensive and heavily calcified lesions cardiologist felt that these were not acute lesions would be very difficult requiring rotational  atherectomy or directional atherectomy. Cardiologist discussed with patient and wife who felt that it would be better to optimize medical management first prior to proceeding with complex intervention.  Patient's blood pressure was managed with clonidine 0.1 mg twice a day, Norvasc 5 mg Staley, Coreg 25 mg twice a day. Additionally on admission patient had chest x-ray showing 1.9 cm nodule in the right upper lobe.  This was correlated with CT chest without contrast (performed without contrast due to plan for PCI the following day along with acute AKI). CT showed several right upper lobe pulmonary nodules, suspicious for metastatic disease, enlarged left paratracheal lymph node and indeterminate right adrenal nodule. After PCI was cancelled patient had CT chest/abd/pelvis with contrast to correlate and this showed additional left hilar lymphadenopathy and further to find the pulmonary nodules in the right upper lobe the largest is bilobed in appearance measuring 2.3 x 1.3 x 2.2 cm. These findings were discussed with oncology who recommended biopsy.  Given that the patient had received Plavix the day prior, interventional radiologists stated that they would not perform biopsy until 5 days after last Plavix dose.  At the time of discharge patient was without symptoms and was satting well on room air and denied any shortness of breath or chest pain with ambulation.  He was discharged with copies of his CT scans as well as reports of his cardiac catheterization and had follow-up with his primary doctor following week.  Issues for Follow Up:  1. Coronary artery disease: Cardiologist recommend optimizing medical management. Continue aspirin 81 mg daily, Plavix 75 mg daily (holding for procedure), Coreg 25 mg twice a day, Lipitor 80 mg daily. 2. Hypertension: Continue clonidine 0.1 mg twice a day, Coreg 25 mg twice a day, amlodipine 5 mg daily.  In light of acute kidney injury, avoid taking quinapril for now.  3. Acute kidney injury: Recently started Lasix 40 mg daily and had CT scans with contrast and cardiac catheterization. Would recommend repeat creatinine at follow-up visit. Primary doctor could consider restarting quinapril at that point if creatinine is at baseline. 4. Pulmonary nodules, paratracheal and left hilar lymphadenopathy, adrenal nodule: Provided patient with CT scans of chest abdomen and pelvis with contrast. Patient is currently holding Plavix last dose was on 03/17/2017. Should follow up with primary doctor for help to navigate care. 5. Type 2 diabetes: A1c during admission was 9.2 and patient had reportedly been on 10 mg twice a day Novolin 70/30.  Patient was initially placed on sliding scale and Lantus 10 units daily. At the time of discharge was requiring 35 units daily and supplemental NovoLog. Would recommend at least 35 units Lantus daily.  Significant Procedures: Cardiac catheterization, CT chest without contrast, CT chest with contrast, CT abdomen and pelvis with contrast  Significant Labs and Imaging:   CT chest/ab/pelvis w/ contrast: IMPRESSION: 1. Pulmonary nodules in the right upper lobe the largest is bilobed in appearance measuring 2.3 x 1.3 x 2.2 cm concerning for metastatic disease. 2. Left lower paratracheal and left hilar lymphadenopathy. Findings are likely metastatic in light of the pulmonary findings and history of left renal cell carcinoma. 3. Indeterminate 2 cm right adrenal nodule potentially representing metastatic disease is well. 4. Left nephrectomy. 5. 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 6. Cholecystectomy. 7. Aortic atherosclerosis.    Recent Labs Lab 03/17/17 0522 03/18/17 0527 03/19/17 0413  WBC 14.9* 9.8 8.9  HGB 13.5 13.9 14.1  HCT 41.5 41.6 42.9  PLT 136* 113* 128*    Recent Labs Lab 03/15/17 1034 03/16/17 0759 03/17/17 0522 03/18/17 0527 03/19/17 0413  NA 140 138 137 136 138  K 4.0 3.6 3.8 3.5 3.5   CL 104 101 100* 102 99*  CO2 25 26 26 25 29   GLUCOSE 259* 332* 258* 262* 222*  BUN 12 26* 29* 23* 19  CREATININE 1.22 1.50* 1.33* 1.19 1.24  CALCIUM 9.3 9.1 8.7* 8.5* 9.4  ALKPHOS  --  74 72  --   --   AST  --  19 27  --   --   ALT  --  18 12*  --   --   ALBUMIN  --  3.6 3.2*  --   --      Results/Tests Pending at Time of Discharge: none  Discharge Medications:  Allergies as of 03/19/2017   No Known Allergies     Medication List    STOP taking these medications   NOVOLIN N 100 UNIT/ML injection Generic drug:  insulin NPH Human     TAKE these medications   amLODipine 5 MG tablet Commonly known as:  NORVASC Take 1 tablet (5 mg total) by mouth daily. Start taking on:  03/20/2017   aspirin 81 MG EC tablet Take 1 tablet (81 mg total) by mouth daily.   atorvastatin 80 MG tablet Commonly known as:  LIPITOR Take 1 tablet (80 mg total) by mouth daily at 6 (six) AM. Start taking on:  03/20/2017   carvedilol 25 MG tablet Commonly known as:  COREG Take 25 mg by mouth 2 (two) times daily with a meal.   cloNIDine 0.1 MG tablet Commonly known as:  CATAPRES Take 1 tablet (0.1 mg total) by mouth 2 (two) times daily. What changed:  when to take this   clopidogrel 75 MG tablet Commonly known as:  PLAVIX Take 1 tablet (75 mg total) by mouth daily.   furosemide 40 MG tablet Commonly known as:  LASIX Take 1 tablet (40 mg total) by mouth daily. Start taking on:  03/20/2017   insulin glargine 100 UNIT/ML injection Commonly known as:  LANTUS Inject 0.35 mLs (35 Units total) into the skin daily. Start taking on:  03/20/2017   levothyroxine 100 MCG tablet Commonly known as:  SYNTHROID, LEVOTHROID Take 100 mcg by mouth daily before breakfast.   nitroGLYCERIN 0.4 mg/hr patch Commonly known as:  NITRODUR - Dosed in mg/24 hr Place 0.4 mg onto the skin daily.       Discharge Instructions: Please refer to Patient Instructions section of EMR for full details.  Patient was  counseled important signs and symptoms that should prompt return to medical care, changes in medications, dietary instructions, activity restrictions, and follow up appointments.   Follow-Up Appointments:   Eloise Levels, MD 03/19/2017, 6:53 PM PGY-1, [place]

## 2017-03-16 NOTE — Progress Notes (Signed)
Records received from [facility] in [place]. Records shared with [facility] with Cardiology and faxed to cath lab directly.   Consent signed for cath lab procedure, pending time.      Call placed to pharmacy, as well as a note in the [facility] requesting redraw/send of lantus, as the provided syringe was inaccurate dose.

## 2017-03-16 NOTE — H&P (View-Only) (Signed)
Info obtained from [facility] by RN includes   05/21/2012 - CABG w/ LIMA-LAD, SVG-Diag, SVG-OM, SVG-PDA   05/25/2012 - PPM inserted, St Jude [employer] RF DR, [id number]  Rosaria Ferries, PA-C 03/16/2017 12:28 PM Beeper [phone]

## 2017-03-16 NOTE — Progress Notes (Signed)
Info obtained from [facility] by RN includes   05/21/2012 - CABG w/ LIMA-LAD, SVG-Diag, SVG-OM, SVG-PDA   05/25/2012 - PPM inserted, St Jude Accent RF DR, [id number]  Rosaria Ferries, PA-C 03/16/2017 12:28 PM Beeper [phone]

## 2017-03-16 NOTE — Progress Notes (Signed)
ANTICOAGULATION CONSULT NOTE - Follow-up Consult  Pharmacy Consult for Heparin Indication: chest pain/ACS  No Known Allergies  Patient Measurements: Height: 5\' 6"  (167.6 cm) Weight: 166 lb (75.3 kg) (scale a) IBW/kg (Calculated) : 63.8  Vital Signs: Temp: 98.4 F (36.9 C) (03/25 2124) Temp Source: Oral (03/25 2124) BP: 145/75 (03/26 0030) Pulse Rate: 81 (03/26 0030)  Labs:  Recent Labs  03/15/17 1034 03/15/17 1554 03/15/17 1834 03/16/17 0031  HGB 16.5  --   --   --   HCT 48.8  --   --   --   PLT 121*  --   --   --   HEPARINUNFRC  --   --   --  0.38  CREATININE 1.22  --   --   --   TROPONINI 0.78* 0.89* 0.76* 0.73*    Estimated Creatinine Clearance: 47.9 mL/min (by C-G formula based on SCr of 1.22 mg/dL).   Assessment: Phillip Patel with elevated troponin on heparin. Heparin level therapeutic (0.38) on gtt at 1050 units/hr. No bleeding noted.  Goal of Therapy:  Heparin level 0.3-0.7 units/ml Monitor platelets by anticoagulation protocol: Yes   Plan:  Continue heparin drip at 1050 units/hr Will f/u 6hr confirmatory heparin level  Sherlon Handing, PharmD, BCPS Clinical pharmacist, pager [phone] 03/16/2017,1:40 AM

## 2017-03-16 NOTE — Progress Notes (Signed)
Call from cath-lab to prep patient. Pt prepped, last coreg administered with one sip of water. Pt's vitals WNL 142/76. Pt denied complaints.   CCMD notified of tele d/c for cath lab. Insulin held for dinner per AM hold order with cath order set.

## 2017-03-17 ENCOUNTER — Encounter (HOSPITAL_COMMUNITY): Payer: Self-pay | Admitting: Interventional Cardiology

## 2017-03-17 ENCOUNTER — Inpatient Hospital Stay (HOSPITAL_COMMUNITY): Payer: Medicare Other

## 2017-03-17 DIAGNOSIS — I214 Non-ST elevation (NSTEMI) myocardial infarction: Principal | ICD-10-CM

## 2017-03-17 DIAGNOSIS — I5031 Acute diastolic (congestive) heart failure: Secondary | ICD-10-CM

## 2017-03-17 DIAGNOSIS — I25110 Atherosclerotic heart disease of native coronary artery with unstable angina pectoris: Secondary | ICD-10-CM

## 2017-03-17 DIAGNOSIS — R911 Solitary pulmonary nodule: Secondary | ICD-10-CM

## 2017-03-17 LAB — GLUCOSE, CAPILLARY
GLUCOSE-CAPILLARY: 225 mg/dL — AB (ref 65–99)
GLUCOSE-CAPILLARY: 226 mg/dL — AB (ref 65–99)
GLUCOSE-CAPILLARY: 286 mg/dL — AB (ref 65–99)
Glucose-Capillary: 227 mg/dL — ABNORMAL HIGH (ref 65–99)

## 2017-03-17 LAB — CBC
HEMATOCRIT: 41.5 % (ref 39.0–52.0)
HEMOGLOBIN: 13.5 g/dL (ref 13.0–17.0)
MCH: 27.9 pg (ref 26.0–34.0)
MCHC: 32.5 g/dL (ref 30.0–36.0)
MCV: 85.7 fL (ref 78.0–100.0)
Platelets: 136 10*3/uL — ABNORMAL LOW (ref 150–400)
RBC: 4.84 MIL/uL (ref 4.22–5.81)
RDW: 14.1 % (ref 11.5–15.5)
WBC: 14.9 10*3/uL — ABNORMAL HIGH (ref 4.0–10.5)

## 2017-03-17 LAB — HEPARIN LEVEL (UNFRACTIONATED): Heparin Unfractionated: 0.3 IU/mL (ref 0.30–0.70)

## 2017-03-17 LAB — COMPREHENSIVE METABOLIC PANEL
ALK PHOS: 72 U/L (ref 38–126)
ALT: 12 U/L — AB (ref 17–63)
ANION GAP: 11 (ref 5–15)
AST: 27 U/L (ref 15–41)
Albumin: 3.2 g/dL — ABNORMAL LOW (ref 3.5–5.0)
BILIRUBIN TOTAL: 1.1 mg/dL (ref 0.3–1.2)
BUN: 29 mg/dL — ABNORMAL HIGH (ref 6–20)
CALCIUM: 8.7 mg/dL — AB (ref 8.9–10.3)
CO2: 26 mmol/L (ref 22–32)
CREATININE: 1.33 mg/dL — AB (ref 0.61–1.24)
Chloride: 100 mmol/L — ABNORMAL LOW (ref 101–111)
GFR, EST AFRICAN AMERICAN: 59 mL/min — AB (ref >60)
GFR, EST NON AFRICAN AMERICAN: 51 mL/min — AB (ref >60)
Glucose, Bld: 258 mg/dL — ABNORMAL HIGH (ref 65–99)
Potassium: 3.8 mmol/L (ref 3.5–5.1)
SODIUM: 137 mmol/L (ref 135–145)
TOTAL PROTEIN: 5.8 g/dL — AB (ref 6.5–8.1)

## 2017-03-17 MED ORDER — TICAGRELOR 90 MG PO TABS: 90.0000 mg | ORAL_TABLET | Freq: Two times a day (BID) | ORAL | Status: DC

## 2017-03-17 MED ORDER — SODIUM CHLORIDE 0.9 % WEIGHT BASED INFUSION: 1.0000 mL/kg/h | INTRAVENOUS | Status: DC

## 2017-03-17 MED ORDER — SODIUM CHLORIDE 0.9 % WEIGHT BASED INFUSION: 3.0000 mL/kg/h | INTRAVENOUS | Status: DC

## 2017-03-17 MED ORDER — ATORVASTATIN CALCIUM 80 MG PO TABS
80.0000 mg | ORAL_TABLET | Freq: Every day | ORAL | Status: DC
  Administered 2017-03-18 – 2017-03-19 (×2): 80 mg via ORAL
  Filled 2017-03-17 (×2): qty 1

## 2017-03-17 MED ORDER — AMLODIPINE BESYLATE 2.5 MG PO TABS
2.5000 mg | ORAL_TABLET | Freq: Every day | ORAL | Status: DC
  Administered 2017-03-17 – 2017-03-18 (×2): 2.5 mg via ORAL
  Filled 2017-03-17 (×2): qty 1

## 2017-03-17 MED ORDER — SODIUM CHLORIDE 0.9% FLUSH: 3.0000 mL | Freq: Two times a day (BID) | INTRAVENOUS | Status: DC

## 2017-03-17 MED ORDER — SODIUM CHLORIDE 0.9 % IV SOLN: 250.0000 mL | INTRAVENOUS | Status: DC | PRN

## 2017-03-17 MED ORDER — ATORVASTATIN CALCIUM 80 MG PO TABS
80.0000 mg | ORAL_TABLET | ORAL | Status: AC
  Administered 2017-03-17: 80 mg via ORAL
  Filled 2017-03-17: qty 1

## 2017-03-17 MED ORDER — SODIUM CHLORIDE 0.9% FLUSH: 3.0000 mL | INTRAVENOUS | Status: DC | PRN

## 2017-03-17 MED ORDER — TICAGRELOR 90 MG PO TABS: 180.0000 mg | ORAL_TABLET | Freq: Once | ORAL | Status: DC

## 2017-03-17 MED ORDER — INSULIN GLARGINE 100 UNIT/ML ~~LOC~~ SOLN
20.0000 [IU] | Freq: Every day | SUBCUTANEOUS | Status: DC
  Administered 2017-03-17: 20 [IU] via SUBCUTANEOUS
  Filled 2017-03-17 (×2): qty 0.2

## 2017-03-17 MED FILL — Lidocaine HCl Local Preservative Free (PF) Inj 1%: INTRAMUSCULAR | Qty: 30 | Status: AC

## 2017-03-17 NOTE — Progress Notes (Signed)
Family Medicine Teaching Service Daily Progress Note Intern Pager: [phone]  Patient name: Phillip Patel Medical record number: [id number] Date of birth: Aug 11, 1941 Age: 76 y.o. Gender: male  Primary Care Provider: Pcp Not In System Consultants: Cardiology Code Status: Full code  Pt Overview and Major Events to Date:  1. Admit to FMTS  Assessment and Plan: [name] is a 76 y.o. male presenting with acute shortness of breath without chest pain, lower extremity swelling or pain. PMH is significant for CABG  2013, pacemaker (st jude), L kidney removal RCC 2007, HTN, diabetes, HLD, hypothyroidism, sleep apnea   NSTEMI: Echo yesterday showed EF 55-60% with G2DD.  Cath yesterday showed mid Cx lesion, 90 %stenosed. Ost 2nd Mrg to 2nd Mrg lesion, 70 %stenosed. The circumflex after this large OM has a 90% stenosis.  Cardiology to perform PCI with stenting on 3/28 around 1300 and started brilinta.  - Cardiology consulted;appreciate recommendations; start brilinta, cont secondary prevention - Continuous cardiac monitoring and pulse ox - Cont heparin drip per pharmacy - Cath today to assess for NSTEMI - d-dimer normal-- will not proceed with CTA - Lasix 20mg  IV BID - Strict I/Os - Titrate oxygen to greater than 92% - ASA 81mg  - duonebs PRN  History of hypertension with hypertensive emergency, improved:  BP controlled. Denies CP, HA or changes in vision.  - clonidine 0.1 mg daily to BID today 3/27 - cont coreg 25 BID - Cardiology following; appreciate recommendations  History of CAD: CABG 4 vessels in 8527 complicated by bradycardia with St. Jude pacemaker (MRI non-compatible). Denying CP currently.  - Cardiology following; appreciate recommendations - f/u repeat EKG - Trending troponins - continuous cardiac monitoring - Continue heparin ggt  Pulmonary nodule: Noted to have 1.9cm nodule on CXR. Do not have patient records and are unclear if this is new. Remote history of  smoking. - discussed with radiology and will proceed with non-contrast CT  Diabetes: CBGs in low 200s on 10U lantus. Takes novolin 70/30 10U BID at home.   A1C 9.2 this admission. Required 26U novolog yesterday.  - increase to 20U lantus in AM - cont mSSI  HLD:  Per patient, takes a statin. - Pharmacy team currently reconciling medication. Will need to follow up  Hypothyroidism: Takes levothyroxine 144mcg daily. TSH WNL - cont levothyroxine 149mcg daily  History of RCC s/p L kidney removal:  In 2007 had total L nephrectomy for RCC and reportedly did not require any additional treatment. Creatinine to 1.2 upon admission and elevated to 1.5 now.  Unclear what his baseline is.  - continue to monitor BMP  Disposition: DC pending Cardiology work up  Subjective:  Feels well this morning without complaints. Denies CP, SOB, NV or diarrhea.   Objective: Temp:  [97.5 F (36.4 C)-98.2 F (36.8 C)] 98.1 F (36.7 C) (03/27 0829) Pulse Rate:  [60-73] 62 (03/27 0829) Resp:  [10-26] 17 (03/27 0829) BP: (115-160)/(60-81) 160/71 (03/27 0829) SpO2:  [93 %-99 %] 94 % (03/27 0829) Weight:  [165 lb 4.8 oz (75 kg)] 165 lb 4.8 oz (75 kg) (03/27 0541) Physical Exam: General: 76yo M sitting on side of bed in NAD Cardiovascular: RRR, no MRG, 2+ palpable pulses Respiratory: NWOB, CTABL, no wheezing or rhonchi Abdomen: soft, NTND Extremities: no lower extremity edema  Laboratory:  Recent Labs Lab 03/15/17 1034 03/16/17 0759 03/17/17 0522  WBC 15.8* 18.1* 14.9*  HGB 16.5 14.9 13.5  HCT 48.8 44.3 41.5  PLT 121* 137* 136*    Recent Labs Lab  03/15/17 1034 03/16/17 0759 03/17/17 0522  NA 140 138 137  K 4.0 3.6 3.8  CL 104 101 100*  CO2 25 26 26   BUN 12 26* 29*  CREATININE 1.22 1.50* 1.33*  CALCIUM 9.3 9.1 8.7*  PROT  --  6.7 5.8*  BILITOT  --  1.4* 1.1  ALKPHOS  --  74 72  ALT  --  18 12*  AST  --  19 27  GLUCOSE 259* 332* 258*    Imaging/Diagnostic Tests: Dg Chest 2  View  Result Date: 03/15/2017 CLINICAL DATA:  Patient with history of bypass surgery. Shortness of breath. EXAM: CHEST  2 VIEW COMPARISON:  None. FINDINGS: Multi lead pacer apparatus overlies the left hemithorax. Patient status post median sternotomy. Aortic atherosclerosis. No large area of pulmonary consolidation. Perihilar interstitial opacities. Biapical pleuroparenchymal thickening. There is a 1.9 cm nodule within the right mid lung. Thoracic spine degenerative changes. IMPRESSION: 1.9 cm nodule within the right mid lung. This needs dedicated evaluation with chest CT in the non acute setting. Cardiac contours upper limits of normal. Perihilar interstitial opacities. A component of pulmonary edema is not excluded. Aortic atherosclerosis. Electronically Signed   By: Lovey Newcomer M.D.   On: 03/15/2017 10:36    Eloise Levels, MD 03/17/2017, 8:36 AM PGY-1, [place] Intern pager: [phone], text pages welcome

## 2017-03-17 NOTE — Progress Notes (Signed)
Orders cleaned up per canceled PCI tomorrow. Per Dr Ellyn Hack the cath procedure is too risky for pt. Pt will not be receiving the PCI. Pt was informed by Dr Ellyn Hack.   Procedure and procedure related orders canceled.

## 2017-03-17 NOTE — Progress Notes (Signed)
ANTICOAGULATION CONSULT NOTE - Follow Up Consult  Pharmacy Consult for Heparin Indication: chest pain/ACS  No Known Allergies  Patient Measurements: Height: 5\' 6"  (167.6 cm) Weight: 165 lb 4.8 oz (75 kg) IBW/kg (Calculated) : 63.8 Heparin Dosing Weight: 75 kg  Vital Signs: Temp: 98.1 F (36.7 C) (03/27 0829) Temp Source: Oral (03/27 0829) BP: 160/71 (03/27 0829) Pulse Rate: 62 (03/27 0829)  Labs:  Recent Labs  03/15/17 1034  03/15/17 1834 03/16/17 0031 03/16/17 0759 03/16/17 1408 03/17/17 0522 03/17/17 1003  HGB 16.5  --   --   --  14.9  --  13.5  --   HCT 48.8  --   --   --  44.3  --  41.5  --   PLT 121*  --   --   --  137*  --  136*  --   LABPROT  --   --   --   --   --  14.3  --   --   INR  --   --   --   --   --  1.10  --   --   HEPARINUNFRC  --   --   --  0.38 0.56  --   --  0.30  CREATININE 1.22  --   --   --  1.50*  --  1.33*  --   TROPONINI 0.78*  < > 0.76* 0.73* 0.69*  --   --   --   < > = values in this interval not displayed.  Estimated Creatinine Clearance: 44 mL/min (A) (by C-G formula based on SCr of 1.33 mg/dL (H)).  Assessment:  Heparin drip resumed ~3am post-cath.  For PCI on 3/28. Brilinta to begin tonight.  Heparin level is low therapeutic (0.30) on 1050 units/hr.   Goal of Therapy:  Heparin level 0.3-0.7 units/ml Monitor platelets by anticoagulation protocol: Yes   Plan:   Increase heparin drip to 1150 units/hr to try to keep level in target range.  Daily heparin level and CBC while on heparin.   Next labs in am.   Arty Baumgartner, [place] Pager: [phone] 03/17/2017,10:48 AM

## 2017-03-17 NOTE — Progress Notes (Signed)
Progress Note  Patient Name: Phillip Patel Date of Encounter: 03/17/2017   Primary Physician: Pcp Not In System Primary Cardiologist: A cardiologist at [facility], [place] Referring physician: Chrisandra Netters  Patient Profile     76 y.o. male w/ h/o CAD-CABG x 4 (LIMA-LAD, SVG-DIAG, SVG-OM, SVG-RPDA) & s/p St. 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 He was visiting from [place], and awakened on the morning of March 25 rib sudden onset of shortness of breath and calling EMS. He denied having any chest pain, but was noted to have severe hypertension 233/10 1 mmHg on around ER via EMS. He was started on IV nitroglycerin and was given Solu-Medrol for wheezing.  Prior to this, he denied any orthopnea or PND at home. No edema. Indicated that he remain compliant with medications.  He has an abnormal EKG with deep Q waves in anterior leads suggestive of prior anterior MI with a ventricular conduction delay. Initially the STEMI doctor (Dr. Burt Knack) was consulted who felt that this was not consistent with acute STEMI.  He has subsequently ruled in with low troponin levels for possible non-STEMI versus demand ischemia in the setting of hypertensive urgency/accelerated malignant hypertension.  Started back on home medications with better blood pressure control. Started on IV heparin. IV nitroglycerin discontinued. IV Lasix administered. Amlodipine 10 mg started.  Subjective   Feels much better today. No further breathing issues. No chest pain.    Inpatient Medications    Scheduled Meds: . aspirin EC  81 mg Oral Daily  . carvedilol  25 mg Oral BID WC  . cloNIDine  0.1 mg Oral BID  . furosemide  20 mg Intravenous BID  . insulin aspart  0-15 Units Subcutaneous TID WC  . insulin glargine  10 Units Subcutaneous Daily  . levothyroxine  100 mcg Oral  QAC breakfast  . sodium chloride flush  3 mL Intravenous Q12H   Continuous Infusions: . sodium chloride 10 mL/hr at 03/16/17 1345  . heparin 1,050 Units/hr (03/17/17 0252)   PRN Meds: sodium chloride, acetaminophen, gi cocktail, ondansetron (ZOFRAN) IV, sodium chloride flush   Vital Signs    Vitals:   03/16/17 2300 03/17/17 0541 03/17/17 0738 03/17/17 0829  BP: 132/68 (!) 153/62 (!) 157/64 (!) 160/71  Pulse: 68 73 65 62  Resp:  18 18 17   Temp:  98.2 F (36.8 C) 98.1 F (36.7 C) 98.1 F (36.7 C)  TempSrc:  Oral Oral Oral  SpO2:  94% 95% 94%  Weight:  75 kg (165 lb 4.8 oz)    Height:        Intake/Output Summary (Last 24 hours) at 03/17/17 0839 Last data filed at 03/17/17 0700  Gross per 24 hour  Intake           1078.4 ml  Output             1150 ml  Net            -71.6 ml   Ins and outs accurately recorded answers 3 "urine occurrences "but not any amount recorded. Weight change would suggest significant diuresis.  Filed Weights   03/15/17 1610 03/16/17 0645 03/17/17 0541  Weight: 75.3 kg (166 lb) 73.6 kg (162 lb 4.8 oz) 75 kg (165 lb 4.8 oz)    Telemetry    NSR - Personally Reviewed  ECG    No new  EKG this morning  Physical Exam   General appearance: alert, cooperative, appears stated age, no distress and Otherwise healthy-appearing Neck: no adenopathy, no carotid bruit, supple, symmetrical, trachea midline and Mild JVD. 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 Lungs: clear to auscultation bilaterally and normal percussion bilaterally Heart: regular rate and rhythm, S1, S2 normal, no murmur, click, rub or gallop and normal apical impulse Abdomen: soft, non-tender; bowel sounds normal; no masses,  no organomegaly Extremities: extremities normal, atraumatic, no cyanosis or edema Pulses: 2+ and symmetric Neurologic: Grossly normal   Labs    Chemistry  Recent Labs Lab 03/15/17 1034 03/16/17 0759 03/17/17 0522   NA 140 138 137  K 4.0 3.6 3.8  CL 104 101 100*  CO2 25 26 26   GLUCOSE 259* 332* 258*  BUN 12 26* 29*  CREATININE 1.22 1.50* 1.33*  CALCIUM 9.3 9.1 8.7*  PROT  --  6.7 5.8*  ALBUMIN  --  3.6 3.2*  AST  --  19 27  ALT  --  18 12*  ALKPHOS  --  74 72  BILITOT  --  1.4* 1.1  GFRNONAA 57* 44* 51*  GFRAA >60 51* 59*  ANIONGAP 11 11 11      Hematology  Recent Labs Lab 03/15/17 1034 03/16/17 0759 03/17/17 0522  WBC 15.8* 18.1* 14.9*  RBC 5.74 5.27 4.84  HGB 16.5 14.9 13.5  HCT 48.8 44.3 41.5  MCV 85.0 84.1 85.7  MCH 28.7 28.3 27.9  MCHC 33.8 33.6 32.5  RDW 13.9 13.8 14.1  PLT 121* 137* 136*    Cardiac Enzymes  Recent Labs Lab 03/15/17 1554 03/15/17 1834 03/16/17 0031 03/16/17 0759  TROPONINI 0.89* 0.76* 0.73* 0.69*   No results for input(s): TROPIPOC in the last 168 hours.   BNP  Recent Labs Lab 03/15/17 1034  BNP 677.2*     DDimer   Recent Labs Lab 03/16/17 1209  DDIMER 0.41     Radiology    Dg Chest 2 View  Result Date: 03/15/2017 CLINICAL DATA:  Patient with history of bypass surgery. Shortness of breath. EXAM: CHEST  2 VIEW COMPARISON:  None. FINDINGS: Multi lead pacer apparatus overlies the left hemithorax. Patient status post median sternotomy. Aortic atherosclerosis. No large area of pulmonary consolidation. Perihilar interstitial opacities. Biapical pleuroparenchymal thickening. There is a 1.9 cm nodule within the right mid lung. Thoracic spine degenerative changes. IMPRESSION: 1.9 cm nodule within the right mid lung. This needs dedicated evaluation with chest CT in the non acute setting. Cardiac contours upper limits of normal. Perihilar interstitial opacities. A component of pulmonary edema is not excluded. Aortic atherosclerosis. Electronically Signed   By: Lovey Newcomer M.D.   On: 03/15/2017 10:36    Cardiac Studies   Cardiac Cath 3/26 :  Hydrate post procedure.  Consider PCI of the circumflex based on renal function.  He will need  aggressive secondary prevention.   Prox LAD lesion, 90 %stenosed. Mid LAD lesion, 100 %stenosed. LIMA to LAD is patent. There is competetive flow.  Origin to Mid Graft lesion, 75 %stenosed in the SVG to Diag. There is competitive flow.  Prox RCA to Mid RCA lesion, 100 %stenosed. SVG to PDA is patent.  SVG to OM Graft was not visualized due to inability to cannulate. - There is no competitive flow in the native circumflex, implying that the SVG to OM is occluded.  Ost Cx to Prox Cx lesion, 80 %stenosed.  Mid  Cx lesion, 90 %stenosed.  Ost 2nd Mrg to 2nd Mrg lesion, 70 %stenosed. The circumflex after this large OM has a 90% stenosis.  There is no aortic valve stenosis. No evidence of aortic dissection on aortic root angiogram.  2D Echo 3/26: Mild LVH. EF 55-60%. GR 2 DD - with high filling pressures.  Assessment & Plan    Principal Problem:   Hypertensive urgency, malignant Active Problems:   Non-ST elevation (NSTEMI) myocardial infarction [facility])   Coronary artery disease involving native coronary artery of native heart with unstable angina pectoris (HCC)   Acute heart failure ([place])  - unclear if combined vs. diastolic   Shortness of breath   Hx of CABG   Lung nodule   Difficult to tell what the true etiology of his presentation is whether this is related to hypertension versus non-STEMI. He has never had issues with uncontrolled hypertension and only missed 1 dose of medications. He did eat a burger the night prior to presentation, this is unusual for him either. D-dimer was negative, and he has evidence of progression of disease in his coronary arteries by catheterization yesterday.  I would be more inclined to exclude ACS first prior to pulmonary embolus. -Likely did not need a CT scan with contrast.  Plan per discussion with Dr. Irish Lack from the Cath Lab would be staged PCI of the native Cx and OM 34 native vessel revascularization. Would opt for medical management of SVG-Diag  lesion.  NPO after MN - planned staged PCI ~1300 with Dr. Irish Lack  Will start [place] today.  Blood pressure notably improved and nitroglycerin infusion has been discontinued.  Continue IV heparin for now pending PCI tomorrow  He is on beta blocker and calcium channel blocker at stable doses for antianginal effect.  Echo reveals relatively preserved cardiac function, but with diastolic dysfunction.  He clearly diuresed, despite not being adequately recorded - lungs are clear. Would continue IV Lasix today given elevated LVEDP on cath yesterday  Pulmonary nodule - would probably still need Chest CT - but can do w/o contrast.   Signed, Glenetta Hew, MD  03/17/2017, 8:39 AM

## 2017-03-17 NOTE — Care Management Note (Addendum)
Case Management Note  Patient Details  Name: Phillip Patel MRN: [id number] Date of Birth: 07-10-1941  Subjective/Objective:                Spoke with patient at the bedside. He states he is in town from [place]. Patient denies difficulties obtaining meds, or getting to MD. Patient is independent for ADLs.  PCP Brayton El with [facility] for Insulin or [facility]    Action/Plan:  No CM needs identified.  Expected Discharge Date:  03/18/17               Expected Discharge Plan:  Home/Self Care  In-House Referral:     Discharge planning Services  CM Consult  Post Acute Care Choice:    Choice offered to:     DME Arranged:    DME Agency:     HH Arranged:    HH Agency:     Status of Service:  Completed, signed off  If discussed at [employer] of Stay Meetings, dates discussed:    Additional Comments:  Carles Collet, RN 03/17/2017, 1:05 PM

## 2017-03-17 NOTE — Progress Notes (Signed)
Inpatient Diabetes Program Recommendations  AACE/ADA: New Consensus Statement on Inpatient Glycemic Control (2015)  Target Ranges:  Prepandial:   less than 140 mg/dL      Peak postprandial:   less than 180 mg/dL (1-2 hours)      Critically ill patients:  140 - 180 mg/dL   Lab Results  Component Value Date   GLUCAP 227 (H) 03/17/2017   HGBA1C 9.2 (H) 03/15/2017    Spoke with pt  @ bedside about A1C results 9.2 (average BG 217)  and explained what an A1C is, basic pathophysiology of DM Type 2, basic home care, basic diabetes diet nutrition principles, importance of checking CBGs and maintaining good CBG control to prevent long-term and short-term complications. Reviewed signs and symptoms of hyperglycemia and hypoglycemia and how to treat hypoglycemia at home. Also reviewed blood sugar goals at home.   Thank you, Nani Gasser. Bevelyn Arriola, RN, MSN, CDE Inpatient Glycemic Control Team Team Pager [phone] (8am-5pm) 03/17/2017 5:36 PM

## 2017-03-17 NOTE — Progress Notes (Addendum)
    I returned the patient's room today to discuss potential PCI with him. After reviewing the images with Dr. Irish Lack and Dr. Claiborne Billings, it is clear to the lesions in question are extensive and heavily calcified. He is a likely not acute lesions and would be a very difficult PCI requiring rotational atherectomy or directional atherectomy.  Based on the difficulty of the procedure the patient is relatively asymptomatic, we discussed risks versus benefits, and the 3 of [place] decided that it would potentially be better to recommend optimizing medical management for his CAD and diastolic heart failure first prior to proceeding with complex intervention.  With that recommendation, I discussed it with the patient in detail explaining the options of high risk rotation left rectally PCI versus medical management where he would then take CDs with his cath films and echo films to his primary cardiologist back in [place] to determine the best course of action if he has recurrence of symptoms.  I did not discuss his CT scan results, but did review them personally. Knowing the findings on the CT scan helped the decision making for opting for medical management and not invasive high-risk PCI. Concern for metastatic disease.  With that in mind, I would run heparin tonight and discontinue tomorrow.  Cancel PCI I will switch her from Brilinta to Plavix.  We will allow him to eat over midnight.  Change to by mouth Lasix tomorrow morning. Then have him and laid in the hallway to determine if he has any exertional dyspnea or chest discomfort.  -- I spent 30 minutes with the patient discussing these options.   Glenetta Hew, M.D., M.S. Interventional Cardiologist   Pager # [phone] Phone # [phone] [address] [place], [place] [address]

## 2017-03-17 NOTE — Progress Notes (Signed)
Taught importance of heart healthy diet

## 2017-03-18 ENCOUNTER — Encounter (HOSPITAL_COMMUNITY)
Admission: EM | Disposition: A | Discharge status: Home / Self Care | Payer: Self-pay | Source: Home / Self Care | Attending: Family Medicine

## 2017-03-18 ENCOUNTER — Inpatient Hospital Stay (HOSPITAL_COMMUNITY): Payer: Medicare Other

## 2017-03-18 LAB — BASIC METABOLIC PANEL
ANION GAP: 9 (ref 5–15)
BUN: 23 mg/dL — ABNORMAL HIGH (ref 6–20)
CALCIUM: 8.5 mg/dL — AB (ref 8.9–10.3)
CO2: 25 mmol/L (ref 22–32)
CREATININE: 1.19 mg/dL (ref 0.61–1.24)
Chloride: 102 mmol/L (ref 101–111)
GFR, EST NON AFRICAN AMERICAN: 58 mL/min — AB (ref >60)
Glucose, Bld: 262 mg/dL — ABNORMAL HIGH (ref 65–99)
Potassium: 3.5 mmol/L (ref 3.5–5.1)
SODIUM: 136 mmol/L (ref 135–145)

## 2017-03-18 LAB — GLUCOSE, CAPILLARY
GLUCOSE-CAPILLARY: 163 mg/dL — AB (ref 65–99)
Glucose-Capillary: 252 mg/dL — ABNORMAL HIGH (ref 65–99)
Glucose-Capillary: 215 mg/dL — ABNORMAL HIGH (ref 65–99)

## 2017-03-18 LAB — CBC
HCT: 41.6 % (ref 39.0–52.0)
Hemoglobin: 13.9 g/dL (ref 13.0–17.0)
MCH: 28.4 pg (ref 26.0–34.0)
MCHC: 33.4 g/dL (ref 30.0–36.0)
MCV: 84.9 fL (ref 78.0–100.0)
PLATELETS: 113 10*3/uL — AB (ref 150–400)
RBC: 4.9 MIL/uL (ref 4.22–5.81)
RDW: 14 % (ref 11.5–15.5)
WBC: 9.8 10*3/uL (ref 4.0–10.5)

## 2017-03-18 LAB — HEPARIN LEVEL (UNFRACTIONATED): HEPARIN UNFRACTIONATED: 0.43 [IU]/mL (ref 0.30–0.70)

## 2017-03-18 SURGERY — CORONARY STENT INTERVENTION
Anesthesia: LOCAL

## 2017-03-18 MED ORDER — FUROSEMIDE 40 MG PO TABS
40.0000 mg | ORAL_TABLET | Freq: Every day | ORAL | Status: DC
  Administered 2017-03-18 – 2017-03-19 (×2): 40 mg via ORAL
  Filled 2017-03-18 (×2): qty 1

## 2017-03-18 MED ORDER — LISINOPRIL 40 MG PO TABS: 40.0000 mg | ORAL_TABLET | Freq: Every day | ORAL | Status: DC

## 2017-03-18 MED ORDER — INSULIN GLARGINE 100 UNIT/ML ~~LOC~~ SOLN
27.0000 [IU] | Freq: Every day | SUBCUTANEOUS | Status: DC
  Administered 2017-03-18: 27 [IU] via SUBCUTANEOUS
  Filled 2017-03-18 (×4): qty 0.27

## 2017-03-18 MED ORDER — AMLODIPINE BESYLATE 5 MG PO TABS
5.0000 mg | ORAL_TABLET | Freq: Every day | ORAL | Status: DC
  Administered 2017-03-19: 5 mg via ORAL
  Filled 2017-03-18: qty 1

## 2017-03-18 MED ORDER — IOPAMIDOL (ISOVUE-300) INJECTION 61%
INTRAVENOUS | Status: AC
  Administered 2017-03-18: 16:00:00
  Filled 2017-03-18: qty 100

## 2017-03-18 MED ORDER — CLOPIDOGREL BISULFATE 75 MG PO TABS
75.0000 mg | ORAL_TABLET | Freq: Every day | ORAL | Status: DC
  Administered 2017-03-18: 75 mg via ORAL
  Filled 2017-03-18 (×2): qty 1

## 2017-03-18 NOTE — Progress Notes (Signed)
Progress Note  Patient Name: Phillip Patel Date of Encounter: 03/18/2017   Primary Physician: Pcp Not In System Primary Cardiologist: A cardiologist at [facility], [place] Referring physician: Chrisandra Netters  Patient Profile     76 y.o. male w/ h/o CAD-CABG x 4 (LIMA-LAD, SVG-DIAG, SVG-OM, SVG-RPDA) & s/p St. 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 He was visiting from [place], and awakened on the morning of March 25 rib sudden onset of shortness of breath and calling EMS. He denied having any chest pain, but was noted to have severe hypertension 233/10 1 mmHg on around ER via EMS. He was started on IV nitroglycerin and was given Solu-Medrol for wheezing.  Prior to this, he denied any orthopnea or PND at home. No edema. Indicated that he remain compliant with medications.  He has an abnormal EKG with deep Q waves in anterior leads suggestive of prior anterior MI with a ventricular conduction delay. Initially the STEMI doctor (Dr. Burt Knack) was consulted who felt that this was not consistent with acute STEMI.  He has subsequently ruled in with low troponin levels for possible non-STEMI versus demand ischemia in the setting of hypertensive urgency/accelerated malignant hypertension.  Started back on home medications with better blood pressure control. Started on IV heparin. IV nitroglycerin discontinued. IV Lasix administered. Amlodipine 10 mg started.  Yesterday evening we discussed plan to cancel what would be a high risk / difficult PCI of Cx-OM3.  Will plan medical management for now. SHORTLY AFTER: CT results suggested diffuse metastatic disease. Subjective   Feels much better today. No further breathing issues. No chest pain.  Ambulating in hall without difficulty. No further dyspnea. Seems to be pretty up-beat despite bad news.  Seems  eager to get back home   Inpatient Medications    Scheduled Meds: . amLODipine  2.5 mg Oral Daily  . aspirin EC  81 mg Oral Daily  . atorvastatin  80 mg Oral Q0600  . carvedilol  25 mg Oral BID WC  . cloNIDine  0.1 mg Oral BID  . clopidogrel  75 mg Oral Daily  . furosemide  40 mg Oral Daily  . insulin aspart  0-15 Units Subcutaneous TID WC  . insulin glargine  27 Units Subcutaneous Daily  . levothyroxine  100 mcg Oral QAC breakfast  . sodium chloride flush  3 mL Intravenous Q12H   Continuous Infusions: . sodium chloride 10 mL/hr at 03/16/17 1345  . heparin 1,150 Units/hr (03/17/17 2125)   PRN Meds: acetaminophen, gi cocktail, ondansetron (ZOFRAN) IV, sodium chloride flush   Vital Signs    Vitals:   03/17/17 2043 03/18/17 0037 03/18/17 0518 03/18/17 0857  BP: (!) 165/61 (!) 162/60 (!) 164/74 (!) 156/70  Pulse: 62 87 65 62  Resp: 20 20 20 20   Temp: 98.4 F (36.9 C) 98.2 F (36.8 C) 98.5 F (36.9 C) 98.7 F (37.1 C)  TempSrc: Oral Oral Oral Oral  SpO2: 98% 97% 95% 95%  Weight:   74.5 kg (164 lb 3.2 oz)   Height:        Intake/Output Summary (Last 24 hours) at 03/18/17 1128 Last data filed at 03/18/17 0809  Gross per 24 hour  Intake          1418.67 ml  Output             1700 ml  Net          -  281.33 ml   Ins and outs accurately recorded answers 3 "urine occurrences "but not any amount recorded. Weight change would suggest significant diuresis.  Filed Weights   03/16/17 0645 03/17/17 0541 03/18/17 0518  Weight: 73.6 kg (162 lb 4.8 oz) 75 kg (165 lb 4.8 oz) 74.5 kg (164 lb 3.2 oz)    Telemetry    NSR - Personally Reviewed  ECG    No new EKG this morning  Physical Exam   General appearance: alert, cooperative, appears stated age, no distress and Otherwise healthy-appearing Neck: no adenopathy, no carotid bruit, supple, symmetrical, trachea midline and Mild JVD. 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 Lungs:  clear to auscultation bilaterally and normal percussion bilaterally Heart: regular rate and rhythm, S1, S2 normal, no murmur, click, rub or gallop and normal apical impulse Abdomen: soft, non-tender; bowel sounds normal; no masses,  no organomegaly Extremities: extremities normal, atraumatic, no cyanosis or edema Pulses: 2+ and symmetric Neurologic: Grossly normal   Labs    Chemistry  Recent Labs Lab 03/16/17 0759 03/17/17 0522 03/18/17 0527  NA 138 137 136  K 3.6 3.8 3.5  CL 101 100* 102  CO2 26 26 25   GLUCOSE 332* 258* 262*  BUN 26* 29* 23*  CREATININE 1.50* 1.33* 1.19  CALCIUM 9.1 8.7* 8.5*  PROT 6.7 5.8*  --   ALBUMIN 3.6 3.2*  --   AST 19 27  --   ALT 18 12*  --   ALKPHOS 74 72  --   BILITOT 1.4* 1.1  --   GFRNONAA 44* 51* 58*  GFRAA 51* 59* >60  ANIONGAP 11 11 9      Hematology  Recent Labs Lab 03/16/17 0759 03/17/17 0522 03/18/17 0527  WBC 18.1* 14.9* 9.8  RBC 5.27 4.84 4.90  HGB 14.9 13.5 13.9  HCT 44.3 41.5 41.6  MCV 84.1 85.7 84.9  MCH 28.3 27.9 28.4  MCHC 33.6 32.5 33.4  RDW 13.8 14.1 14.0  PLT 137* 136* 113*    Cardiac Enzymes  Recent Labs Lab 03/15/17 1554 03/15/17 1834 03/16/17 0031 03/16/17 0759  TROPONINI 0.89* 0.76* 0.73* 0.69*   No results for input(s): TROPIPOC in the last 168 hours.   BNP  Recent Labs Lab 03/15/17 1034  BNP 677.2*     DDimer   Recent Labs Lab 03/16/17 1209  DDIMER 0.41     Radiology    Reviewed  Ct Chest Wo Contrast  Result Date: 03/17/2017 CLINICAL DATA:  Right-sided pulmonary nodule on chest radiographs. History of left nephrectomy for renal cell carcinoma. EXAM: CT CHEST WITHOUT CONTRAST TECHNIQUE: Multidetector CT imaging of the chest was performed following the standard protocol without IV contrast. COMPARISON:  Chest radiographs 03/15/2017. FINDINGS: Cardiovascular: Extensive atherosclerosis of the aorta, great vessels and coronary arteries status post CABG. Left subclavian pacemaker leads  appear unchanged. No acute vascular findings identified on noncontrast imaging. The heart size is normal. There is no pericardial effusion. Mediastinum/Nodes: There is an enlarged left peritracheal node measuring 1.5 cm short axis on image 56. There is a 9 mm precarinal node on image 57. No other enlarged mediastinal, hilar or axillary lymph nodes are seen. Hilar evaluation is limited by the lack of intravenous contrast. The thyroid gland, trachea and esophagus demonstrate no significant findings. Lungs/Pleura: Small bilateral pleural effusions. Corresponding with the radiographic abnormality is a bilobed right upper lobe nodule measuring 2.5 x 1.4 cm on image 58. This is lobulated, but  not spiculated or calcified. There are additional smaller right upper lobe nodules, including an 8 mm nodule on image 45. Smaller nodules are present on images 42 and 43. Tubular soft tissue structure in the left lower lobe (images 84 through 89) likely represents a mucous impacted bronchus. No suspicious pulmonary nodules on the left. There is atelectasis at both lung bases. There is scattered relatively mild paraseptal emphysema with an upper lobe predominance. Upper abdomen: Postsurgical changes status post left nephrectomy. No evidence of left adrenal mass. There is a 2.0 x 1.8 cm right adrenal nodule measuring 47 HU (image 148). Diffuse aortic and branch vessel atherosclerosis noted. Musculoskeletal/Chest wall: There is no chest wall mass or suspicious osseous finding. IMPRESSION: 1. CT confirms the presence of several right upper lobe pulmonary nodules, suspicious for metastatic disease from the patient's renal cell carcinoma. Primary lung cancer considered less likely. 2. Enlarged left paratracheal lymph node, also potentially metastatic disease. 3. Indeterminate right adrenal nodule, also potentially metastatic disease. 4. Small pleural effusions, bibasilar atelectasis and diffuse atherosclerosis noted. Electronically Signed    By: Richardean Sale M.D.   On: 03/17/2017 16:57    Cardiac Studies   Cardiac Cath 3/26 :  Hydrate post procedure.  Consider PCI of the circumflex based on renal function.  He will need aggressive secondary prevention.   Prox LAD lesion, 90 %stenosed. Mid LAD lesion, 100 %stenosed. LIMA to LAD is patent. There is competetive flow.  Origin to Mid Graft lesion, 75 %stenosed in the SVG to Diag. There is competitive flow.  Prox RCA to Mid RCA lesion, 100 %stenosed. SVG to PDA is patent.  SVG to OM Graft was not visualized due to inability to cannulate. - There is no competitive flow in the native circumflex, implying that the SVG to OM is occluded.  Ost Cx to Prox Cx lesion, 80 %stenosed.  Mid Cx lesion, 90 %stenosed.  Ost 2nd Mrg to 2nd Mrg lesion, 70 %stenosed. The circumflex after this large OM has a 90% stenosis.  There is no aortic valve stenosis. No evidence of aortic dissection on aortic root angiogram.  2D Echo 3/26: Mild LVH. EF 55-60%. GR 2 DD - with high filling pressures.  Assessment & Plan    Principal Problem:   Hypertensive urgency, malignant Active Problems:   Non-ST elevation (NSTEMI) myocardial infarction [facility])   Coronary artery disease involving native coronary artery of native heart with unstable angina pectoris (HCC)   Acute heart failure ([place])  - unclear if combined vs. diastolic   Shortness of breath   Hx of CABG   Lung nodule   Difficult to tell what the true etiology of his presentation is whether this is related to hypertension versus non-STEMI. He has never had issues with uncontrolled hypertension and only missed 1 dose of medications. He did eat a burger the night prior to presentation, this is unusual for him either. D-dimer was negative, and he has evidence of progression of disease in his coronary arteries by catheterization Monday. ACS vs. HTN urgency with [place] ischemia in setting of significant existing CAD.  NO obvious culprit lesion - could be  SVG-Diag or native Cx (SVG-OM occluded & significant native Cx disease).     Plan per discussion with Dr. Irish Lack & Dr. Claiborne Billings  from the Cath Lab yesterday evening was to cancel staged PCI of the native Cx and OM 3 -- Would opt for medical management instead given the High Risk/ Complex Atherectomy-PCI given the nature of disease. (see detailed  explanation from last PM note  Converted to Plavix + ASA  Would d/c IV Heparin  Consider adding long-acting nitrate (Imdur30 mg) vs. Amlodipine (if BP remains elevated) for anti-anginal/afterload reduction Rx.  On statin   He is on beta blocker and calcium channel blocker at stable doses for antianginal effect.  Echo reveals relatively preserved cardiac function, but with diastolic dysfunction.  He clearly diuresed, despite not being adequately recorded - lungs are clear. Would continue IV Lasix today given elevated LVEDP on cath   Converted to PO Lasix today  Not on ACE-I/ARB due to unilateral nephrectomy (presumably) - defer to primary cardiologist.  Pulmonary nodule - CT with evidence of diffuse Mets - thankfully this was discovered before we did PCI.   Plan for now is for him to return home to [place] f/u with primary cardiologist - we are going to provide a CD with Cath films & Echo (with my card) for him to take to [facility]   Thank you for asking for our help in caring for this very pleasant gentleman.   Signed, Glenetta Hew, MD  03/18/2017, 11:28 AM

## 2017-03-18 NOTE — Progress Notes (Signed)
Inpatient Diabetes Program Recommendations  AACE/ADA: New Consensus Statement on Inpatient Glycemic Control (2015)  Target Ranges:  Prepandial:   less than 140 mg/dL      Peak postprandial:   less than 180 mg/dL (1-2 hours)      Critically ill patients:  140 - 180 mg/dL   Lab Results  Component Value Date   GLUCAP 252 (H) 03/18/2017   HGBA1C 9.2 (H) 03/15/2017    Review of Glycemic Control Results for Phillip Patel, Phillip Patel (MRN [id number]) as of 03/18/2017 11:34  Ref. Range 03/17/2017 07:36 03/17/2017 11:26 03/17/2017 16:52 03/17/2017 21:35 03/18/2017 07:29  Glucose-Capillary Latest Ref Range: 65 - 99 mg/dL 225 (H) 226 (H) 227 (H) 286 (H) 252 (H)   Diabetes history: DM2 Outpatient Diabetes medications: NPH 10 units bid Current orders for Inpatient glycemic control: Lantus 10 units + 0-15 units tid  Inpatient Diabetes Program Recommendations:   Noted postprandial CBGs elevated. Please consider adding Novolog 5 units tid meal coverage if eats 50%.  Thank you, Nani Gasser. Vicy Medico, RN, MSN, CDE Inpatient Glycemic Control Team Team Pager [phone] (8am-5pm) 03/18/2017 11:41 AM

## 2017-03-18 NOTE — Progress Notes (Signed)
Discussed patient's case with Dr. Julien Nordmann (oncology) over the phone. Dr. Julien Nordmann also reviewed patient's CT chest. Dr. Julien Nordmann thinks that patient's lung nodules may be easy for IR to biopsy. He recommends that we consult them for tissue diagnosis. He also agrees with CT abdomen/pelvis to rule out recurrence of cancer in the abdomen. If IR cannot do it, we could consider calling Dr. Servando Snare or Dr. Roxan Hockey for possible bronch to obtain tissue specimens. Greatly appreciate Dr. Worthy Flank input.  Hyman Bible, MD

## 2017-03-18 NOTE — Progress Notes (Signed)
ANTICOAGULATION CONSULT NOTE - Follow Up Consult  Pharmacy Consult for Heparin Indication: chest pain/ACS  No Known Allergies  Patient Measurements: Height: 5\' 6"  (167.6 cm) Weight: 164 lb 3.2 oz (74.5 kg) (scale a) IBW/kg (Calculated) : 63.8 Heparin Dosing Weight: 74.5 kg  Vital Signs: Temp: 98.7 F (37.1 C) (03/28 0857) Temp Source: Oral (03/28 0857) BP: 156/70 (03/28 0857) Pulse Rate: 62 (03/28 0857)  Labs:  Recent Labs  03/15/17 1834  03/16/17 0031  03/16/17 0759 03/16/17 1408 03/17/17 0522 03/17/17 1003 03/18/17 0527  HGB  --   --   --   < > 14.9  --  13.5  --  13.9  HCT  --   --   --   --  44.3  --  41.5  --  41.6  PLT  --   --   --   --  137*  --  136*  --  113*  LABPROT  --   --   --   --   --  14.3  --   --   --   INR  --   --   --   --   --  1.10  --   --   --   HEPARINUNFRC  --   < > 0.38  --  0.56  --   --  0.30 0.43  CREATININE  --   --   --   --  1.50*  --  1.33*  --  1.19  TROPONINI 0.76*  --  0.73*  --  0.69*  --   --   --   --   < > = values in this interval not displayed.  Estimated Creatinine Clearance: 49.1 mL/min (by C-G formula based on SCr of 1.19 mg/dL).  Assessment:   Heparin level  is therapeutic (0.43) on 1150 units/hr.   PCI cancelled.  Brilinta changed to Plavix.    Plan noted to stop heparin drip tomorrow.   Goal of Therapy:  Heparin level 0.3-0.7 units/ml Monitor platelets by anticoagulation protocol: Yes   Plan:   Continue heparin drip at 1150 units/hr.  Daily heparin level and CBC while on heparin.  Arty Baumgartner, [place] Pager: [phone] 03/18/2017,10:45 AM

## 2017-03-18 NOTE — Progress Notes (Signed)
Pt educated about safety and importance of bed alarm during the night however pt refuses to be on bed alarm. Will continue to round on patient.   Jaceion Aday, RN    

## 2017-03-18 NOTE — Progress Notes (Signed)
Family Medicine Teaching Service Daily Progress Note Intern Pager: [phone]  Patient name: Phillip Patel Medical record number: [id number] Date of birth: 05/25/1941 Age: 76 y.o. Gender: male  Primary Care Provider: Pcp Not In System Consultants: Cardiology Code Status: Full code  Pt Overview and Major Events to Date:  1. Admit to FMTS  Assessment and Plan: Phillip Patel is a 76 y.o. male presenting with acute shortness of breath without chest pain, lower extremity swelling or pain. PMH is significant for CABG  2013, pacemaker (st jude), L kidney removal RCC 2007, HTN, diabetes, HLD, hypothyroidism, sleep apnea   NSTEMI:  On further evaluation, Cardiology will not perform PCI due to high risk of procedure at this time due to extensive and heavy calcification.  Switched from [place] to plavix. Will have patient ambulate in hall to determine if any exertional dyspnea or CP.  - Cardiology consulted;appreciate recommendations; cont plavix, ASA and cont other secondary prevention  - Continuous cardiac monitoring and pulse ox - Discontinue heparin gtt - Lasix 20mg  IV BID- 1600cc output, will switch to lasix 40mg  PO - Strict I/Os - Titrate oxygen to greater than 92% - ASA 81mg  - duonebs PRN  History of hypertension with hypertensive emergency, improved:  BP elevated past 24 hours.  Denies CP, HA or changes in vision.  - clonidine 0.1 mg daily to BID today 3/27 - increase norvasc to 5mg  daily - cont coreg 25 BID - Cardiology following; appreciate recommendations  Pulmonary nodule: Noted to have 1.9cm nodule on CXR. Do not have patient records and are unclear if this is new. Remote history of smoking. CT chest showed several right upper lobe pulmonary nodules, suspicious for metastatic disease as well as enlarged left paratracheal lymph node, and an indeterminate right adrenal nodule, also potentially metastatic disease. - CT abd/pelvis - will discuss with onc  History of CAD: CABG 4  vessels in 5573 complicated by bradycardia with St. Jude pacemaker (MRI non-compatible). Denying CP currently. - Cardiology following; appreciate recommendations - continuous cardiac monitoring - Continue plavix and ASA  Diabetes: CBGs in mid 200s on 20U lantus Takes novolin 70/30 10U BID at home.   A1C 9.2 this admission. Required 15U novolog yesterday.  - increase to 27U lantus in AM - cont mSSI   HLD:  Per patient, takes a statin. - Pharmacy team currently reconciling medication. Will need to follow up  Hypothyroidism: Takes levothyroxine 119mcg daily. TSH WNL - cont levothyroxine 145mcg daily  History of RCC s/p L kidney removal:  In 2007 had total L nephrectomy for RCC and reportedly did not require any additional treatment. Creatinine to 1.2 upon admission and elevated to 1.5 now.  Unclear what his baseline is.  - continue to monitor BMP  Disposition: DC pending Cardiology work up  Subjective:  Feels well this morning and denies CP, SOB or LE swelling.  Feels optimistic about CT findings.   Objective: Temp:  [96.8 F (36 C)-98.5 F (36.9 C)] 98.5 F (36.9 C) (03/28 0518) Pulse Rate:  [62-87] 65 (03/28 0518) Resp:  [17-20] 20 (03/28 0518) BP: (143-165)/(60-85) 164/74 (03/28 0518) SpO2:  [94 %-98 %] 95 % (03/28 0518) Weight:  [164 lb 3.2 oz (74.5 kg)] 164 lb 3.2 oz (74.5 kg) (03/28 0518) Physical Exam: General: 76yo M sitting on side of bed in NAD Cardiovascular: RRR, no MRG, 2+ palpable pulses Respiratory: NWOB, CTABL, no wheezing or rhonchi Abdomen: soft, NTND Extremities: no lower extremity edema  Laboratory:  Recent Labs Lab 03/16/17 0759 03/17/17 0522  03/18/17 0527  WBC 18.1* 14.9* 9.8  HGB 14.9 13.5 13.9  HCT 44.3 41.5 41.6  PLT 137* 136* 113*    Recent Labs Lab 03/16/17 0759 03/17/17 0522 03/18/17 0527  NA 138 137 136  K 3.6 3.8 3.5  CL 101 100* 102  CO2 26 26 25   BUN 26* 29* 23*  CREATININE 1.50* 1.33* 1.19  CALCIUM 9.1 8.7* 8.5*  PROT  6.7 5.8*  --   BILITOT 1.4* 1.1  --   ALKPHOS 74 72  --   ALT 18 12*  --   AST 19 27  --   GLUCOSE 332* 258* 262*    Imaging/Diagnostic Tests: Ct Chest Wo Contrast  Result Date: 03/17/2017 CLINICAL DATA:  Right-sided pulmonary nodule on chest radiographs. History of left nephrectomy for renal cell carcinoma. EXAM: CT CHEST WITHOUT CONTRAST TECHNIQUE: Multidetector CT imaging of the chest was performed following the standard protocol without IV contrast. COMPARISON:  Chest radiographs 03/15/2017. FINDINGS: Cardiovascular: Extensive atherosclerosis of the aorta, great vessels and coronary arteries status post CABG. Left subclavian pacemaker leads appear unchanged. No acute vascular findings identified on noncontrast imaging. The heart size is normal. There is no pericardial effusion. Mediastinum/Nodes: There is an enlarged left peritracheal node measuring 1.5 cm short axis on image 56. There is a 9 mm precarinal node on image 57. No other enlarged mediastinal, hilar or axillary lymph nodes are seen. Hilar evaluation is limited by the lack of intravenous contrast. The thyroid gland, trachea and esophagus demonstrate no significant findings. Lungs/Pleura: Small bilateral pleural effusions. Corresponding with the radiographic abnormality is a bilobed right upper lobe nodule measuring 2.5 x 1.4 cm on image 58. This is lobulated, but not spiculated or calcified. There are additional smaller right upper lobe nodules, including an 8 mm nodule on image 45. Smaller nodules are present on images 42 and 43. Tubular soft tissue structure in the left lower lobe (images 84 through 89) likely represents a mucous impacted bronchus. No suspicious pulmonary nodules on the left. There is atelectasis at both lung bases. There is scattered relatively mild paraseptal emphysema with an upper lobe predominance. Upper abdomen: Postsurgical changes status post left nephrectomy. No evidence of left adrenal mass. There is a 2.0 x 1.8  cm right adrenal nodule measuring 47 HU (image 148). Diffuse aortic and branch vessel atherosclerosis noted. Musculoskeletal/Chest wall: There is no chest wall mass or suspicious osseous finding. IMPRESSION: 1. CT confirms the presence of several right upper lobe pulmonary nodules, suspicious for metastatic disease from the patient's renal cell carcinoma. Primary lung cancer considered less likely. 2. Enlarged left paratracheal lymph node, also potentially metastatic disease. 3. Indeterminate right adrenal nodule, also potentially metastatic disease. 4. Small pleural effusions, bibasilar atelectasis and diffuse atherosclerosis noted. Electronically Signed   By: Richardean Sale M.D.   On: 03/17/2017 16:57    Eloise Levels, MD 03/18/2017, 7:08 AM PGY-1, [place] Intern pager: [phone], text pages welcome

## 2017-03-19 LAB — CBC
HCT: 42.9 % (ref 39.0–52.0)
HEMOGLOBIN: 14.1 g/dL (ref 13.0–17.0)
MCH: 27.7 pg (ref 26.0–34.0)
MCHC: 32.9 g/dL (ref 30.0–36.0)
MCV: 84.3 fL (ref 78.0–100.0)
Platelets: 128 10*3/uL — ABNORMAL LOW (ref 150–400)
RBC: 5.09 MIL/uL (ref 4.22–5.81)
RDW: 13.8 % (ref 11.5–15.5)
WBC: 8.9 10*3/uL (ref 4.0–10.5)

## 2017-03-19 LAB — GLUCOSE, CAPILLARY
GLUCOSE-CAPILLARY: 206 mg/dL — AB (ref 65–99)
GLUCOSE-CAPILLARY: 233 mg/dL — AB (ref 65–99)
Glucose-Capillary: 219 mg/dL — ABNORMAL HIGH (ref 65–99)

## 2017-03-19 LAB — BASIC METABOLIC PANEL
Anion gap: 10 (ref 5–15)
BUN: 19 mg/dL (ref 6–20)
CHLORIDE: 99 mmol/L — AB (ref 101–111)
CO2: 29 mmol/L (ref 22–32)
CREATININE: 1.24 mg/dL (ref 0.61–1.24)
Calcium: 9.4 mg/dL (ref 8.9–10.3)
GFR calc Af Amer: >60 mL/min (ref >60)
GFR calc non Af Amer: 56 mL/min — ABNORMAL LOW (ref >60)
GLUCOSE: 222 mg/dL — AB (ref 65–99)
POTASSIUM: 3.5 mmol/L (ref 3.5–5.1)
SODIUM: 138 mmol/L (ref 135–145)

## 2017-03-19 LAB — HEPARIN LEVEL (UNFRACTIONATED): Heparin Unfractionated: <0.1 IU/mL — ABNORMAL LOW (ref 0.30–0.70)

## 2017-03-19 MED ORDER — ATORVASTATIN CALCIUM 80 MG PO TABS: 80.0000 mg | ORAL_TABLET | Freq: Every day | ORAL | 0 refills | Status: AC

## 2017-03-19 MED ORDER — FUROSEMIDE 40 MG PO TABS: 40.0000 mg | ORAL_TABLET | Freq: Every day | ORAL | 0 refills | Status: AC

## 2017-03-19 MED ORDER — AMLODIPINE BESYLATE 5 MG PO TABS: 5.0000 mg | ORAL_TABLET | Freq: Every day | ORAL | 0 refills | Status: AC

## 2017-03-19 MED ORDER — CLOPIDOGREL BISULFATE 75 MG PO TABS: 75.0000 mg | ORAL_TABLET | Freq: Every day | ORAL | 0 refills | Status: AC

## 2017-03-19 MED ORDER — ASPIRIN 81 MG PO TBEC: 81.0000 mg | DELAYED_RELEASE_TABLET | Freq: Every day | ORAL | 0 refills | Status: AC

## 2017-03-19 MED ORDER — INSULIN GLARGINE 100 UNIT/ML ~~LOC~~ SOLN: 35.0000 [IU] | Freq: Every day | SUBCUTANEOUS | 11 refills | Status: AC

## 2017-03-19 MED ORDER — CLONIDINE HCL 0.1 MG PO TABS: 0.1000 mg | ORAL_TABLET | Freq: Two times a day (BID) | ORAL | 11 refills | Status: AC

## 2017-03-19 MED ORDER — INSULIN GLARGINE 100 UNIT/ML ~~LOC~~ SOLN
35.0000 [IU] | Freq: Every day | SUBCUTANEOUS | Status: DC
  Administered 2017-03-19: 35 [IU] via SUBCUTANEOUS
  Filled 2017-03-19: qty 0.35

## 2017-03-19 NOTE — Progress Notes (Signed)
Family Medicine Teaching Service Daily Progress Note Intern Pager: [phone]  Patient name: Phillip Patel Medical record number: [id number] Date of birth: Sep 13, 1941 Age: 76 y.o. Gender: male  Primary Care Provider: Pcp Not In System Consultants: Cardiology Code Status: Full code  Pt Overview and Major Events to Date:  1. Admit to FMTS  Assessment and Plan: Phillip Patel is a 76 y.o. male presenting with acute shortness of breath without chest pain, lower extremity swelling or pain. PMH is significant for CABG  2013, pacemaker (st jude), L kidney removal RCC 2007, HTN, diabetes, HLD, hypothyroidism, sleep apnea   NSTEMI:  Discontinued heparin yesterday and switched from brilinta to plavix. Will have patient ambulate in hall. Cardiology, Dr. Ellyn Hack provided patient with CD of his  - Cardiology consulted;appreciate recommendations; holding plavix and cont ASA and cont other secondary prevention  - Continuous cardiac monitoring and pulse ox - lasix 40mg  PO - Strict I/Os - Titrate oxygen to greater than 92% - ASA 81mg  - duonebs PRN  History of hypertension with hypertensive emergency, improved:  BP WNL. Denies CP, HA or changes in vision.  - clonidine 0.1 mg daily to BID - norvasc to 5mg  daily - cont coreg 25 BID - Cardiology following; appreciate recommendations  Pulmonary nodules concerning for metastatic disease:  Pulmonary nodules in the right upper lobe the largest is bilobed in appearance measuring 2.3 x 1.3 x 2.2 cm concerning for metastatic Disease. Left lower paratracheal and left hilar lymphadenopathy. Findings are likely metastatic in light of the pulmonary findings and history of left renal cell carcinoma and Indeterminate 2 cm right adrenal nodule potentially representing metastatic disease is well. Discussed with Oncology who recommend biopsy.  IR feels that lung biopsy would be difficult to achieve and are asking to first consult pulm/CVTS. Given that patient is from out  of town we will likely DC and have him follow up in [facility].  - will provide patient with images of CT  History of CAD: CABG 4 vessels in 6629 complicated by bradycardia with St. Jude pacemaker (MRI non-compatible). Denying CP currently. - Cardiology following; appreciate recommendations - continuous cardiac monitoring - Continue plavix and ASA  Diabetes: CBGs in low 200s on 27U lantus Takes novolin 70/30 10U BID at home.   A1C 9.2 this admission. Required 16U novolog yesterday.  - increase to 35U lantus in AM - cont mSSI   HLD:  Per patient, takes a statin. - Pharmacy team currently reconciling medication. Will need to follow up  Hypothyroidism: Takes levothyroxine 17mcg daily. TSH WNL - cont levothyroxine 177mcg daily  History of RCC s/p L kidney removal:  In 2007 had total L nephrectomy for RCC and reportedly did not require any additional treatment. Creatinine to 1.2 upon admission and elevated to 1.5 now.  Unclear what his baseline is.  - continue to monitor BMP  Disposition: DC pending Cardiology work up  Subjective:  Feels well this morning and denies CP, SOB or LE swelling.    Objective: Temp:  [98 F (36.7 C)-98.7 F (37.1 C)] 98 F (36.7 C) (03/29 0548) Pulse Rate:  [62-64] 63 (03/29 0548) Resp:  [20] 20 (03/29 0548) BP: (117-159)/(60-70) 148/64 (03/29 0548) SpO2:  [94 %-97 %] 94 % (03/29 0548) Weight:  [161 lb 4.8 oz (73.2 kg)] 161 lb 4.8 oz (73.2 kg) (03/29 0548) Physical Exam: General: 76yo M sitting on side of bed in NAD Cardiovascular: RRR, no MRG, 2+ palpable pulses Respiratory: NWOB, CTABL, no wheezing or rhonchi Abdomen: soft, NTND Extremities:  no lower extremity edema  Laboratory:  Recent Labs Lab 03/17/17 0522 03/18/17 0527 03/19/17 0413  WBC 14.9* 9.8 8.9  HGB 13.5 13.9 14.1  HCT 41.5 41.6 42.9  PLT 136* 113* 128*    Recent Labs Lab 03/16/17 0759 03/17/17 0522 03/18/17 0527 03/19/17 0413  NA 138 137 136 138  K 3.6 3.8 3.5 3.5  CL  101 100* 102 99*  CO2 26 26 25 29   BUN 26* 29* 23* 19  CREATININE 1.50* 1.33* 1.19 1.24  CALCIUM 9.1 8.7* 8.5* 9.4  PROT 6.7 5.8*  --   --   BILITOT 1.4* 1.1  --   --   ALKPHOS 74 72  --   --   ALT 18 12*  --   --   AST 19 27  --   --   GLUCOSE 332* 258* 262* 222*    Imaging/Diagnostic Tests: Ct Chest Wo Contrast  Result Date: 03/17/2017 CLINICAL DATA:  Right-sided pulmonary nodule on chest radiographs. History of left nephrectomy for renal cell carcinoma. EXAM: CT CHEST WITHOUT CONTRAST TECHNIQUE: Multidetector CT imaging of the chest was performed following the standard protocol without IV contrast. COMPARISON:  Chest radiographs 03/15/2017. FINDINGS: Cardiovascular: Extensive atherosclerosis of the aorta, great vessels and coronary arteries status post CABG. Left subclavian pacemaker leads appear unchanged. No acute vascular findings identified on noncontrast imaging. The heart size is normal. There is no pericardial effusion. Mediastinum/Nodes: There is an enlarged left peritracheal node measuring 1.5 cm short axis on image 56. There is a 9 mm precarinal node on image 57. No other enlarged mediastinal, hilar or axillary lymph nodes are seen. Hilar evaluation is limited by the lack of intravenous contrast. The thyroid gland, trachea and esophagus demonstrate no significant findings. Lungs/Pleura: Small bilateral pleural effusions. Corresponding with the radiographic abnormality is a bilobed right upper lobe nodule measuring 2.5 x 1.4 cm on image 58. This is lobulated, but not spiculated or calcified. There are additional smaller right upper lobe nodules, including an 8 mm nodule on image 45. Smaller nodules are present on images 42 and 43. Tubular soft tissue structure in the left lower lobe (images 84 through 89) likely represents a mucous impacted bronchus. No suspicious pulmonary nodules on the left. There is atelectasis at both lung bases. There is scattered relatively mild paraseptal  emphysema with an upper lobe predominance. Upper abdomen: Postsurgical changes status post left nephrectomy. No evidence of left adrenal mass. There is a 2.0 x 1.8 cm right adrenal nodule measuring 47 HU (image 148). Diffuse aortic and branch vessel atherosclerosis noted. Musculoskeletal/Chest wall: There is no chest wall mass or suspicious osseous finding. IMPRESSION: 1. CT confirms the presence of several right upper lobe pulmonary nodules, suspicious for metastatic disease from the patient's renal cell carcinoma. Primary lung cancer considered less likely. 2. Enlarged left paratracheal lymph node, also potentially metastatic disease. 3. Indeterminate right adrenal nodule, also potentially metastatic disease. 4. Small pleural effusions, bibasilar atelectasis and diffuse atherosclerosis noted. Electronically Signed   By: Richardean Sale M.D.   On: 03/17/2017 16:57   Ct Chest W Contrast  Result Date: 03/18/2017 CLINICAL DATA:  Left nephrectomy for renal cell carcinoma, evaluate for metastasis. EXAM: CT CHEST, ABDOMEN, AND PELVIS WITH CONTRAST TECHNIQUE: Multidetector CT imaging of the chest, abdomen and pelvis was performed following the standard protocol during bolus administration of intravenous contrast. CONTRAST:  100 cc Isovue 370 IV COMPARISON:  Chest CT exams from 03/17/2017 FINDINGS: CT CHEST FINDINGS Cardiovascular: Status post CABG. Left-sided  subclavian pacemaker apparatus with intact leads. Extensive atherosclerosis of the aorta, great vessels and coronary arteries. Normal size cardiac chambers. No pericardial effusion. Mediastinum/Nodes: 3.2 x 1.6 x 2.8 cm left hilar lymphadenopathy with approximately 2 cm left lower paratracheal lymph node and 0.8 cm subcarinal short axis lymph node. Lungs/Pleura: Bilobed right upper lobe pulmonary nodule measuring approximately 2.3 x 1.3 x 2.2 cm with smaller subcentimeter nodules ranging in size from 0.3 cm through 0.8 cm also in the right upper lobe. Paraseptal  emphysema in the upper lobes. Bibasilar atelectasis with minimal bronchiectasis. Musculoskeletal: No chest wall mass or suspicious bone lesions identified. CT ABDOMEN PELVIS FINDINGS Hepatobiliary: No space-occupying mass or biliary dilatation of the liver. Status post cholecystectomy. Pancreas: Normal axial normal Spleen: Normal Adrenals/Urinary Tract: Left nephrectomy with compensatory hypertrophy of the right kidney. 2 x 1.7 cm right adrenal enhancing nodule. No left-sided retroperitoneal abnormality identified. Surgical clips in the left renal bed. Stomach/Bowel: Stomach is within normal limits. Appendix appears normal. No evidence of bowel wall thickening, distention, or inflammatory changes. Vascular/Lymphatic: Aortic atherosclerosis without aneurysm. Reproductive: Markedly enlarged prostate gland measuring 7.1 cm transverse by 5.5 cm AP by 6 cm craniocaudad. Other: No free air free fluid. Musculoskeletal: Lower lumbar degenerative facet arthropathy. Osteophytic abnormalities. Mild bilateral femoral head and neck osteopenia. IMPRESSION: 1. Pulmonary nodules in the right upper lobe the largest is bilobed in appearance measuring 2.3 x 1.3 x 2.2 cm concerning for metastatic disease. 2. Left lower paratracheal and left hilar lymphadenopathy. Findings are likely metastatic in light of the pulmonary findings and history of left renal cell carcinoma. 3. Indeterminate 2 cm right adrenal nodule potentially representing metastatic disease is well. 4. Left nephrectomy. 5. 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 6. Cholecystectomy. 7. Aortic atherosclerosis. Electronically Signed   By: Ashley Royalty M.D.   On: 03/18/2017 20:36   Ct Abdomen Pelvis W Contrast  Result Date: 03/18/2017 CLINICAL DATA:  Left nephrectomy for renal cell carcinoma, evaluate for metastasis. EXAM: CT CHEST, ABDOMEN, AND PELVIS WITH CONTRAST TECHNIQUE: Multidetector CT imaging of the chest, abdomen and pelvis was performed  following the standard protocol during bolus administration of intravenous contrast. CONTRAST:  100 cc Isovue 370 IV COMPARISON:  Chest CT exams from 03/17/2017 FINDINGS: CT CHEST FINDINGS Cardiovascular: Status post CABG. Left-sided subclavian pacemaker apparatus with intact leads. Extensive atherosclerosis of the aorta, great vessels and coronary arteries. Normal size cardiac chambers. No pericardial effusion. Mediastinum/Nodes: 3.2 x 1.6 x 2.8 cm left hilar lymphadenopathy with approximately 2 cm left lower paratracheal lymph node and 0.8 cm subcarinal short axis lymph node. Lungs/Pleura: Bilobed right upper lobe pulmonary nodule measuring approximately 2.3 x 1.3 x 2.2 cm with smaller subcentimeter nodules ranging in size from 0.3 cm through 0.8 cm also in the right upper lobe. 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 Bibasilar atelectasis with minimal bronchiectasis. Musculoskeletal: No chest wall mass or suspicious bone lesions identified. CT ABDOMEN PELVIS FINDINGS Hepatobiliary: No space-occupying mass or biliary dilatation of the liver. Status post cholecystectomy. Pancreas: Normal axial normal Spleen: Normal Adrenals/Urinary Tract: Left nephrectomy with compensatory hypertrophy of the right kidney. 2 x 1.7 cm right adrenal enhancing nodule. No left-sided retroperitoneal abnormality identified. Surgical clips in the left renal bed. Stomach/Bowel: Stomach is within normal limits. Appendix appears normal. No evidence of bowel wall thickening, distention, or inflammatory changes. Vascular/Lymphatic: Aortic atherosclerosis without aneurysm. Reproductive: Markedly enlarged prostate gland measuring 7.1 cm transverse by 5.5 cm AP by 6 cm craniocaudad. Other: No free  air free fluid. Musculoskeletal: Lower lumbar degenerative facet arthropathy. Osteophytic abnormalities. Mild bilateral femoral head and neck osteopenia. IMPRESSION: 1. Pulmonary nodules in the right upper lobe the largest is bilobed in appearance  measuring 2.3 x 1.3 x 2.2 cm concerning for metastatic disease. 2. Left lower paratracheal and left hilar lymphadenopathy. Findings are likely metastatic in light of the pulmonary findings and history of left renal cell carcinoma. 3. Indeterminate 2 cm right adrenal nodule potentially representing metastatic disease is well. 4. Left nephrectomy. 5. 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 6. Cholecystectomy. 7. Aortic atherosclerosis. Electronically Signed   By: Ashley Royalty M.D.   On: 03/18/2017 20:36    Eloise Levels, MD 03/19/2017, 7:14 AM PGY-1, [place] Intern pager: [phone], text pages welcome

## 2017-03-19 NOTE — Progress Notes (Signed)
    Brief sign off note:  Pt was up walking in the hall without any CP or dyspnea -- makes me feel better about deciding on Med Rx.  CD with Cath films & Echos delivered.  No new recommendations - continue current Rx.  I understand the plan is Biopsy prior to d/c (- hold plavix)  Glenetta Hew, MD'

## 2017-03-19 NOTE — Progress Notes (Signed)
Patient with no complaints or concerns during 7pm - 7am shift. Slept during the night.   Gini Caputo, RN 

## 2017-03-19 NOTE — Care Management Important Message (Signed)
Important Message  Patient Details  Name: Phillip Patel MRN: [id number] Date of Birth: 10/13/1941   Medicare Important Message Given:  Yes    [name] 03/19/2017, 11:14 AM

## 2017-03-19 NOTE — Progress Notes (Signed)
Aware of request for biopsy of lung nodule.  Dr. Earleen Newport has reviewed the patient's imaging and feels the lung biopsy would not be easy as the approach would like be anterior traversing the entire lung putting the patient at very high risk for PTX and other complications.  He does have some hilar LNs etc.  We have asked the primary service to discuss the case with either pulm/CT surgery to weigh in on possibility of a bx through bronchoscopy.  If everyone is a "no" then we would possibly reconsider.  He is also on plavix and this would have to be held for 5 days.  Discussed with primary service.  Please reconsult if needed.  Denai Caba E 12:11 PM 03/19/2017

## 2017-03-19 NOTE — Discharge Instructions (Signed)
You were admitted for shortness of breath and found to have some new blockages in your heart.  We have provided you with a CD of this study to bring to your cardiologist back home.  Continue aspirin and your new blood pressure medications.  You are now taking clonidine 0.1mg  twice daily instead of once.  We have also stopped your quinapril because your kidney function was a little abnormal and have started amlodipine 5mg  once daily.  Your primary doctor may want to check your creatinine and at a follow up visit and consider restarting this.   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  We have provided you with a CD of your CT scan of your chest/abdomen and pelvis.   Lastly, we have made some changes to your diabetes medication. Your hemoglobin A1C was >9.  We have discharged you with instructions to take 35 units of lantus in the morning.  Please stop the novolin.  Please discuss these changes with your primary doctor.

## 2017-03-23 ENCOUNTER — Telehealth: Payer: Self-pay | Admitting: *Deleted

## 2017-03-23 NOTE — Telephone Encounter (Signed)
Called to schedule hospital follow-up apt. Patient resides in [facility] and will be seeing his local PCP.

## 2018-04-30 IMAGING — CR DG CHEST 2V
2 series · 2 of 2 positions shown · non-contrast
Comparison: None.

CLINICAL DATA: Patient with history of bypass surgery. Shortness of
breath.

EXAM:
CHEST  2 VIEW

[chest pa]
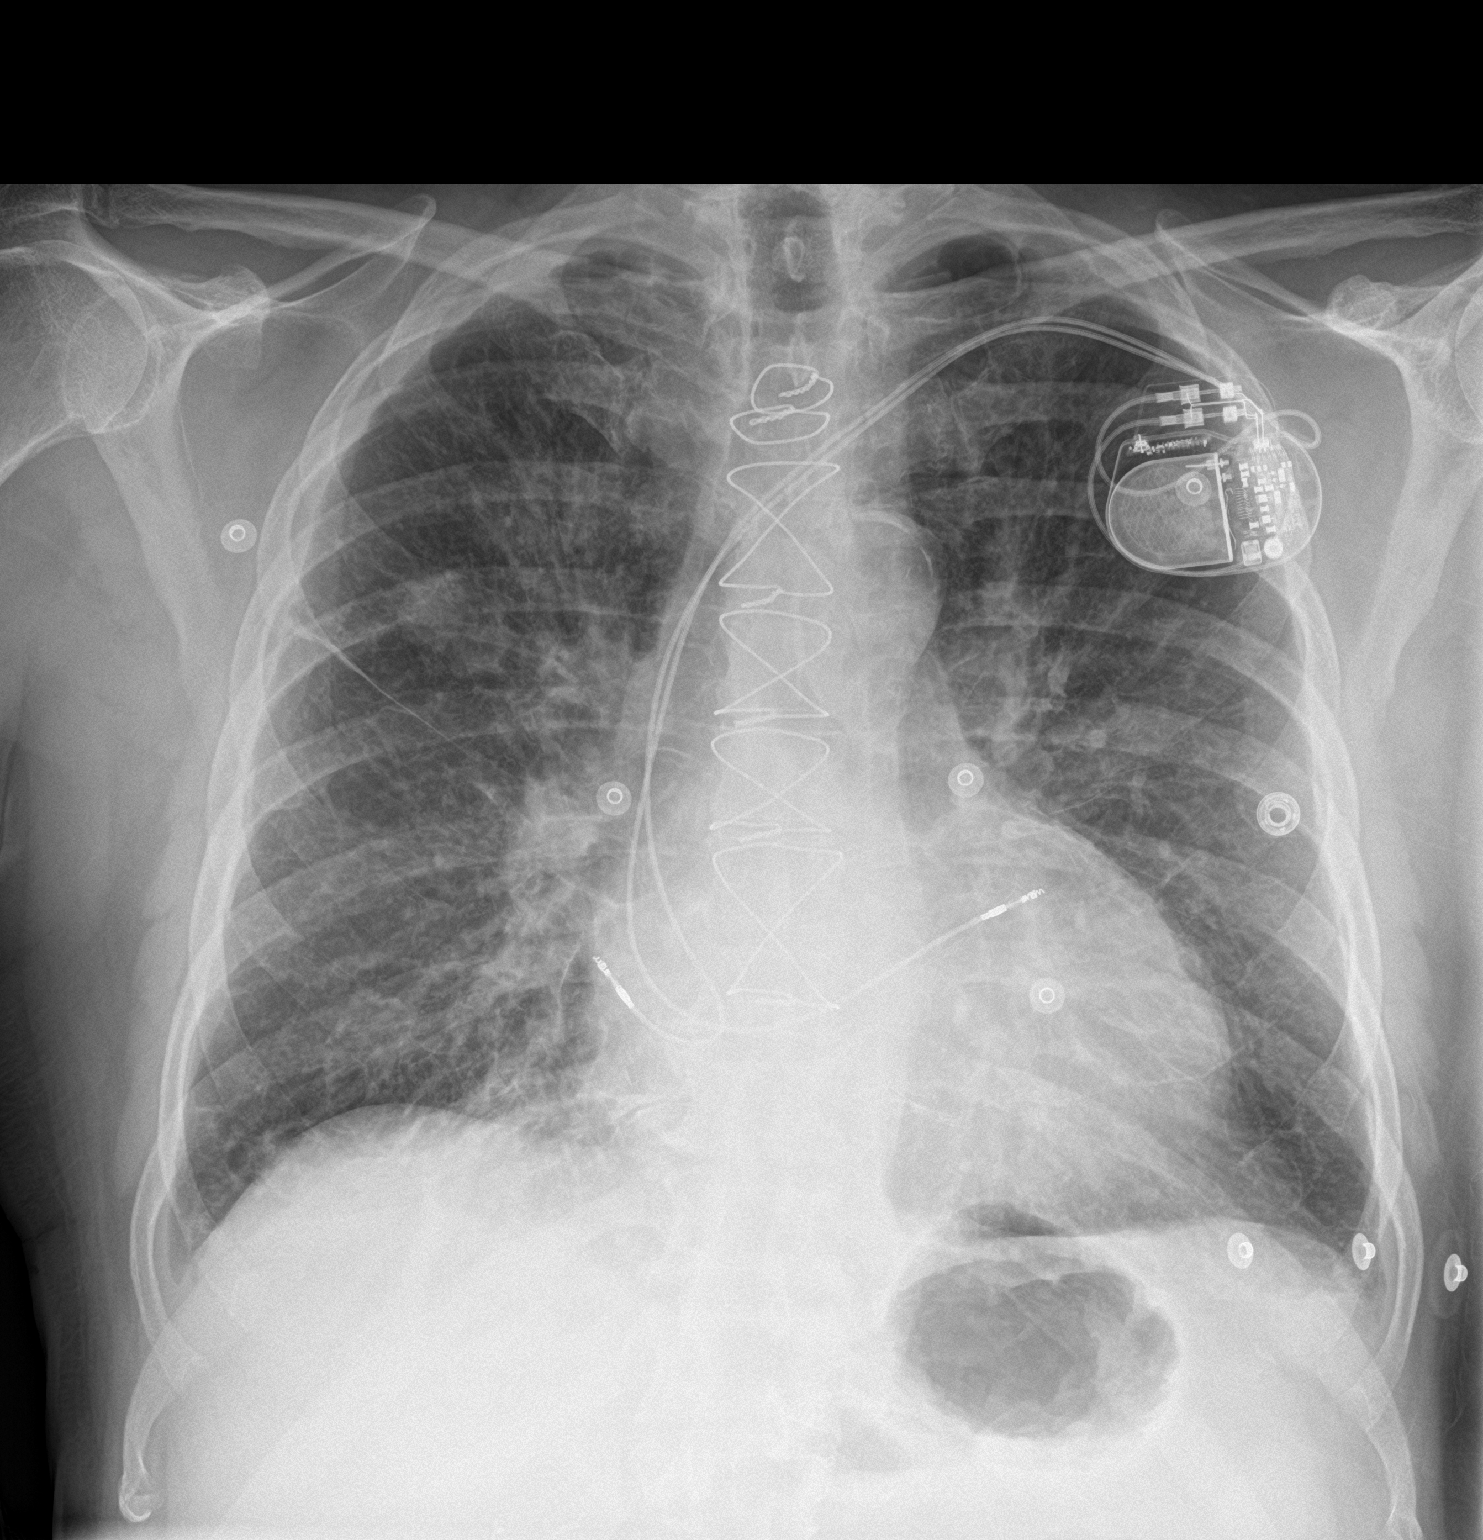

[chest lat]
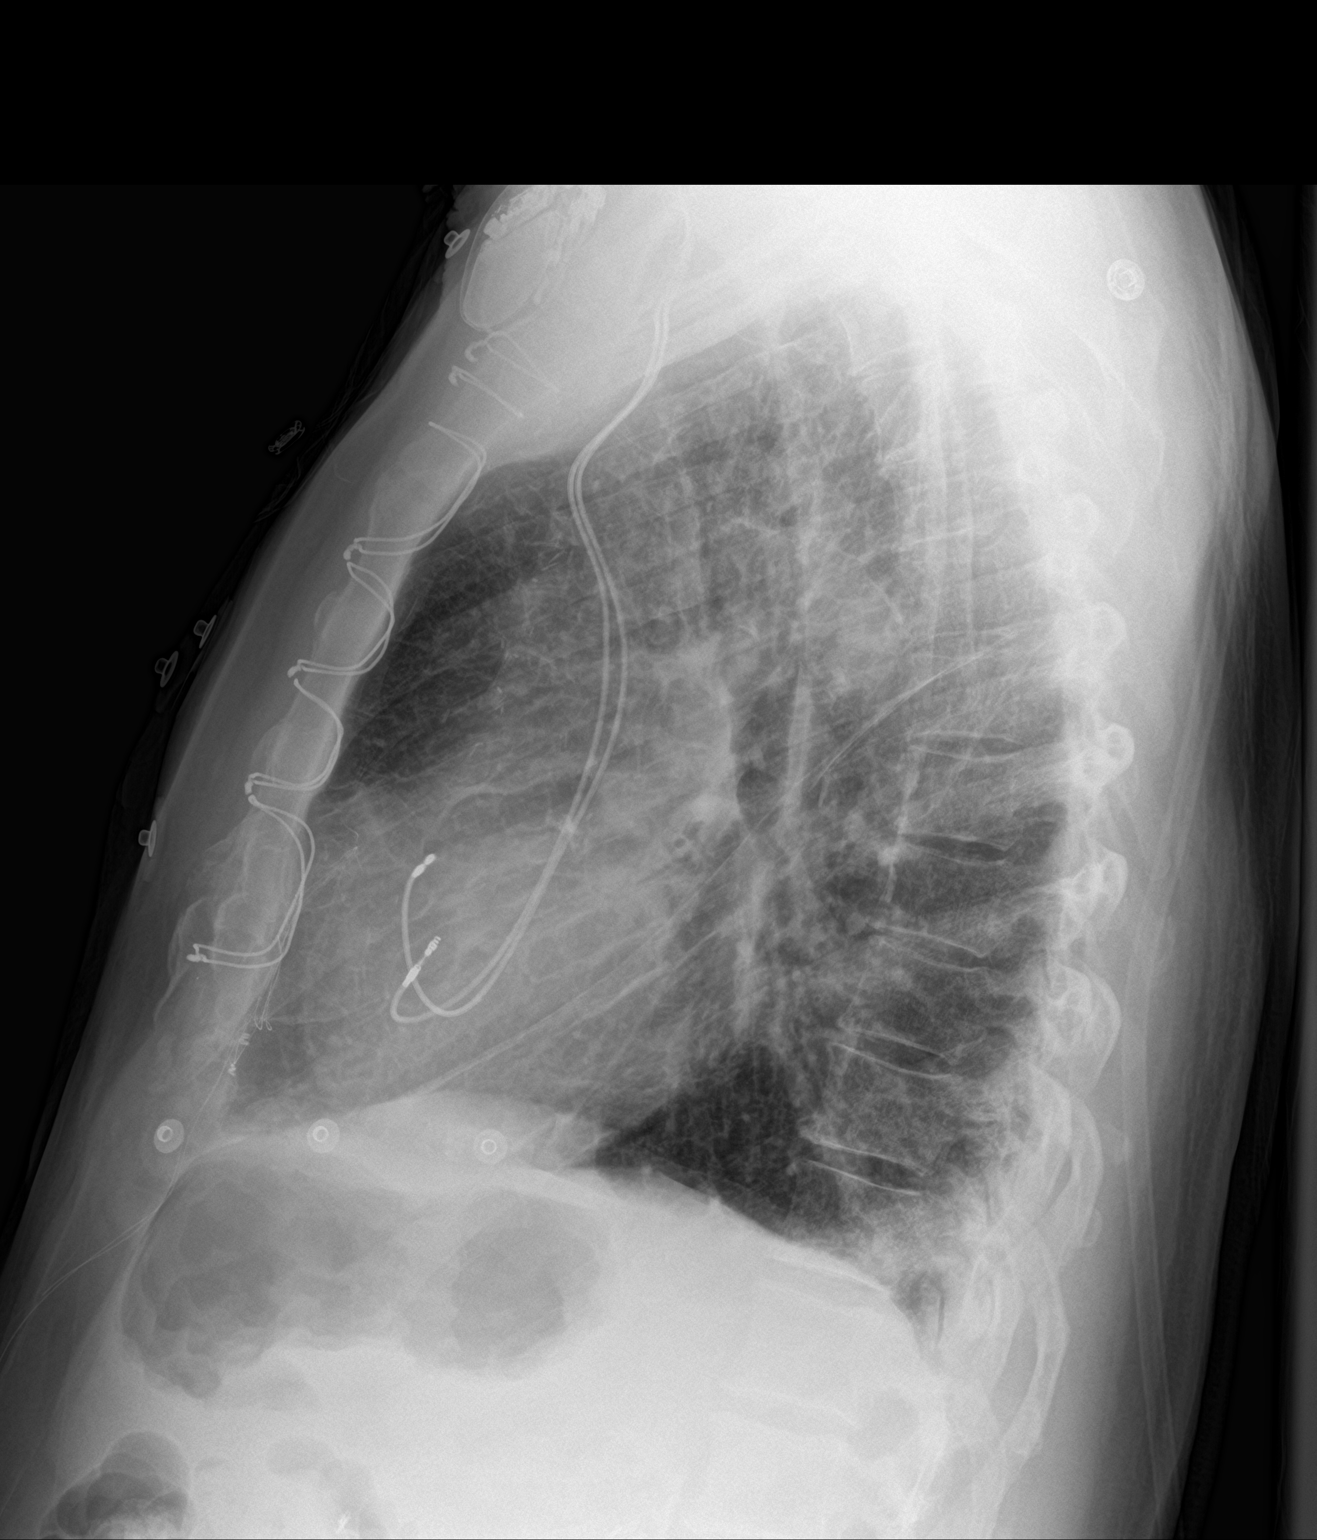

[2 of 2 positions shown; findings below may reference images not displayed]

FINDINGS: Multi lead pacer apparatus overlies the left hemithorax. Patient
status post median sternotomy. Aortic atherosclerosis. No large area
of pulmonary consolidation. Perihilar interstitial opacities.
Biapical pleuroparenchymal thickening. There is a 1.9 cm nodule
within the right mid lung. Thoracic spine degenerative changes.
IMPRESSION: 1.9 cm nodule within the right mid lung. This needs dedicated
evaluation with chest CT in the non acute setting.

Cardiac contours upper limits of normal. Perihilar interstitial
opacities. A component of pulmonary edema is not excluded.

Aortic atherosclerosis.

## 2018-05-03 IMAGING — CT CT CHEST W/ CM
2 of 5 series · 13 of 36 positions shown, 16 images · IV contrast (Omni 300)
Comparison: Chest CT exams from 03/17/2017

CLINICAL DATA: Left nephrectomy for renal cell carcinoma, evaluate
for metastasis.

EXAM:
CT CHEST, ABDOMEN, AND PELVIS WITH CONTRAST
TECHNIQUE: Multidetector CT imaging of the chest, abdomen and pelvis was
performed following the standard protocol during bolus
administration of intravenous contrast.
CONTRAST:  100 cc Isovue 370 IV

[Series 3: cap with 5mm st · axial · 0.80mm/px · z∈[+705,+1245]mm · 10 of 134 slices shown, 13 images]
[im 13/134  mediastinal]
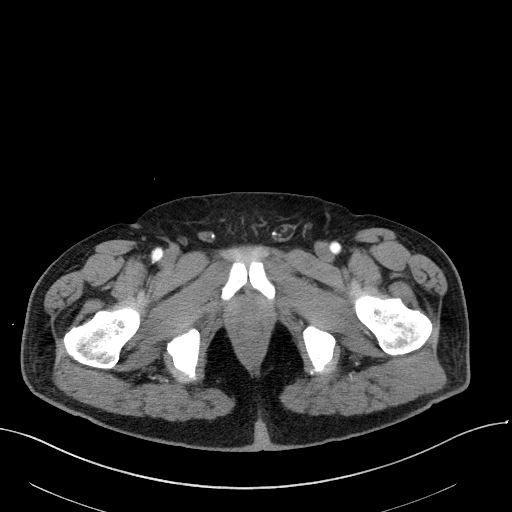
[im 13/134  lung]
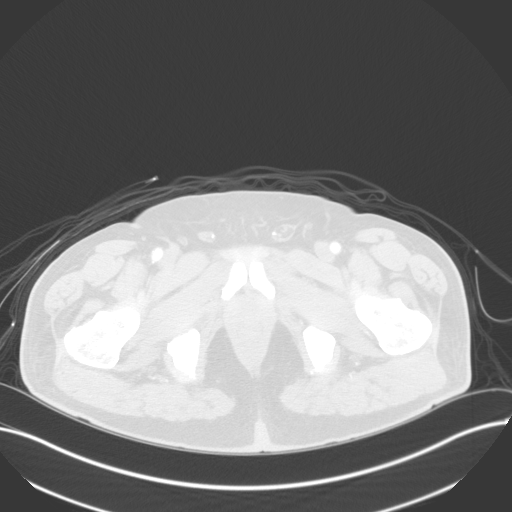
[im 25/134  lung]
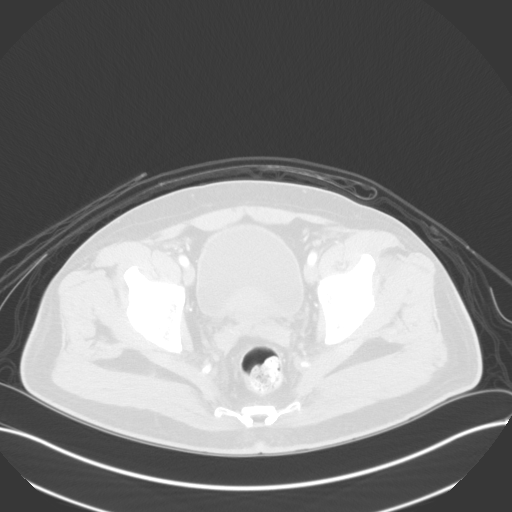
[im 37/134  lung]
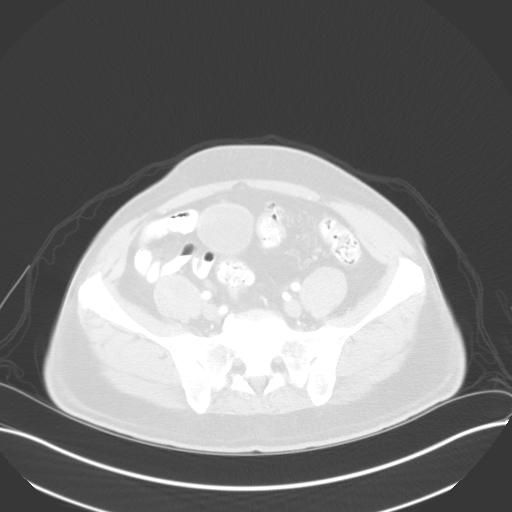
[im 49/134  lung]
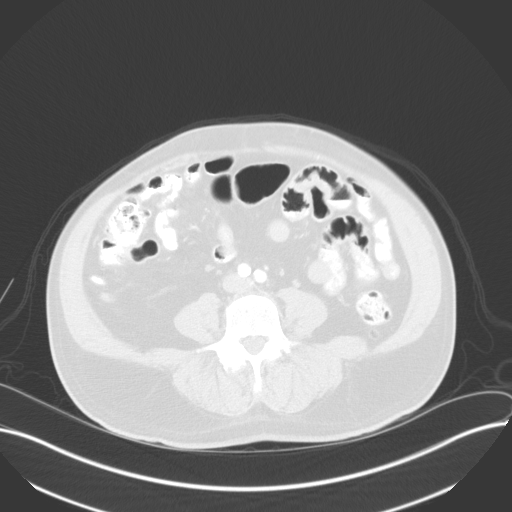
[im 61/134  mediastinal]
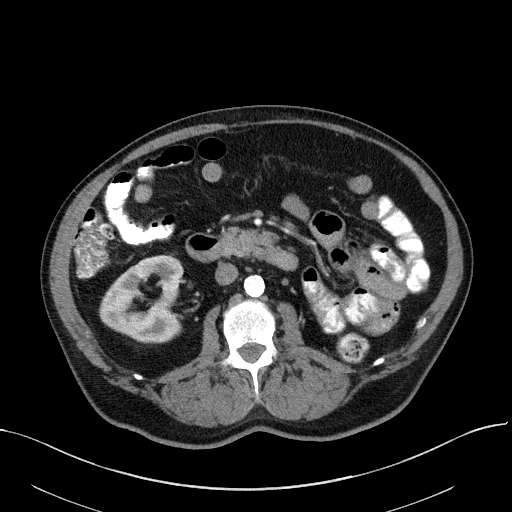
[im 61/134  lung]
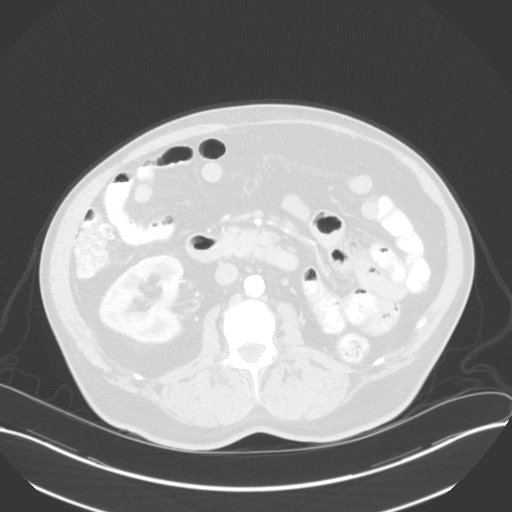
[im 73/134  lung]
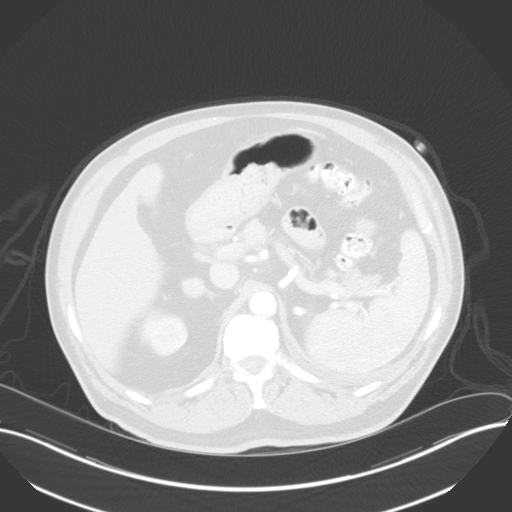
[im 85/134  lung]
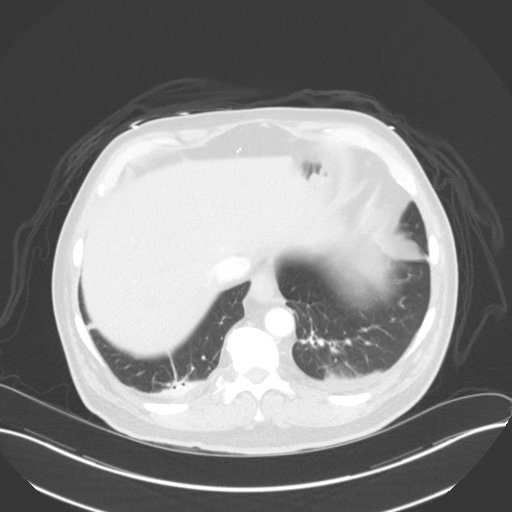
[im 97/134  lung]
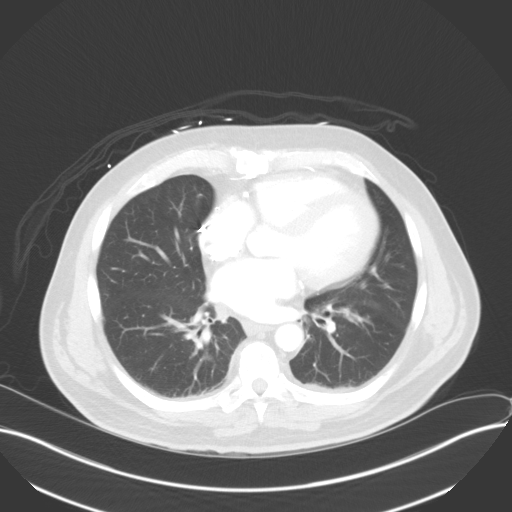
[im 109/134  mediastinal]
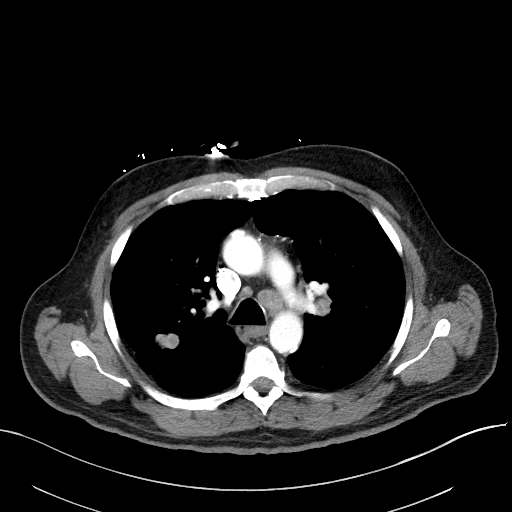
[im 109/134  lung]
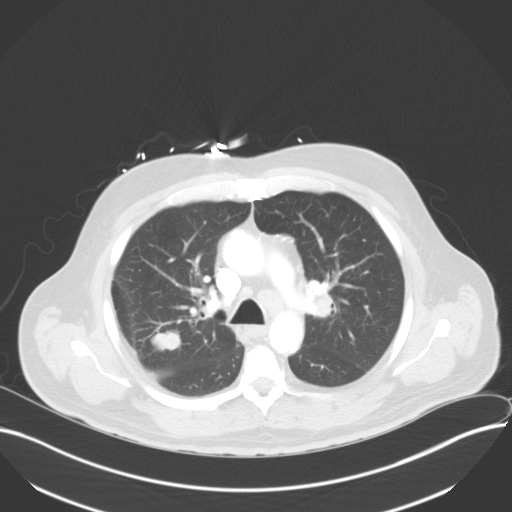
[im 121/134  lung]
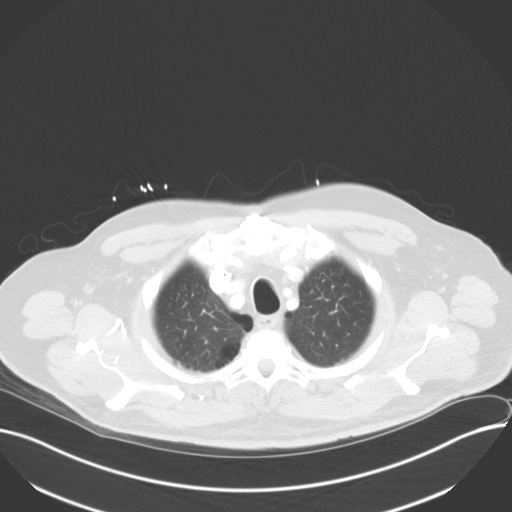

[Series 5: cap with 3mm st cor · coronal · 0.75mm/px · 3 of 151 slices shown]
[im 31/151  lung]
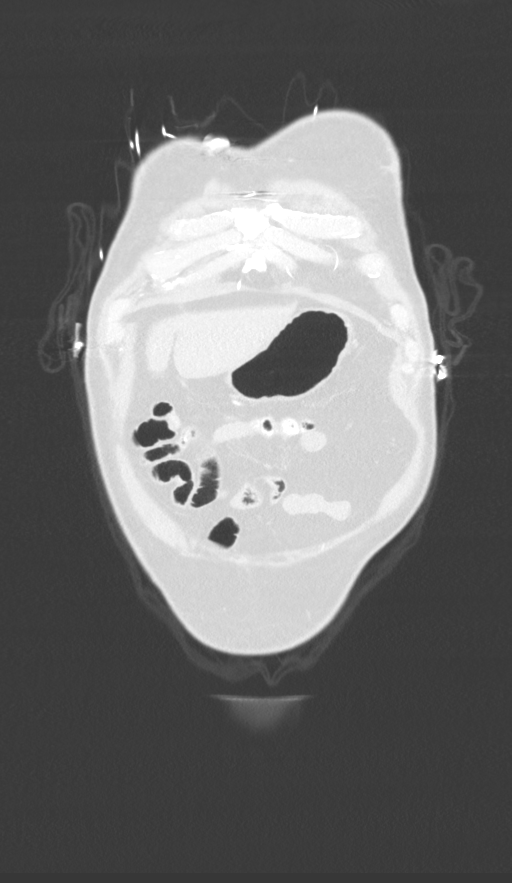
[im 61/151  lung]
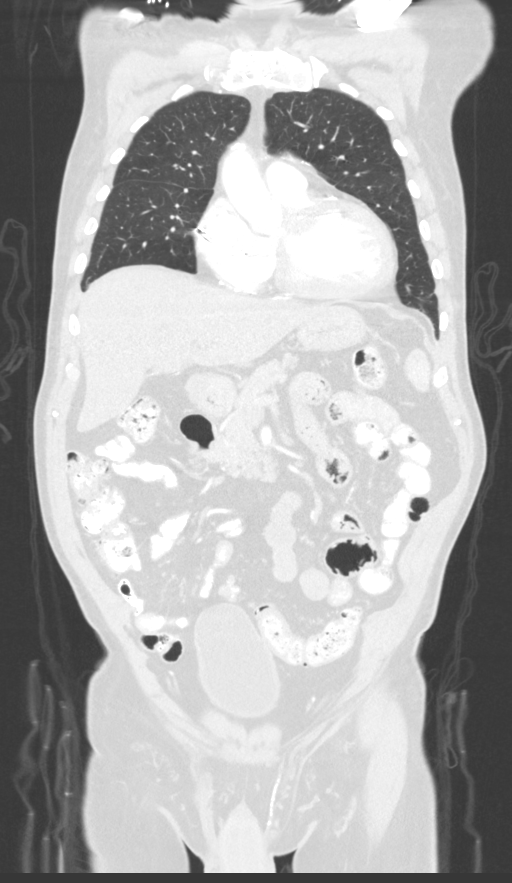
[im 91/151  lung]
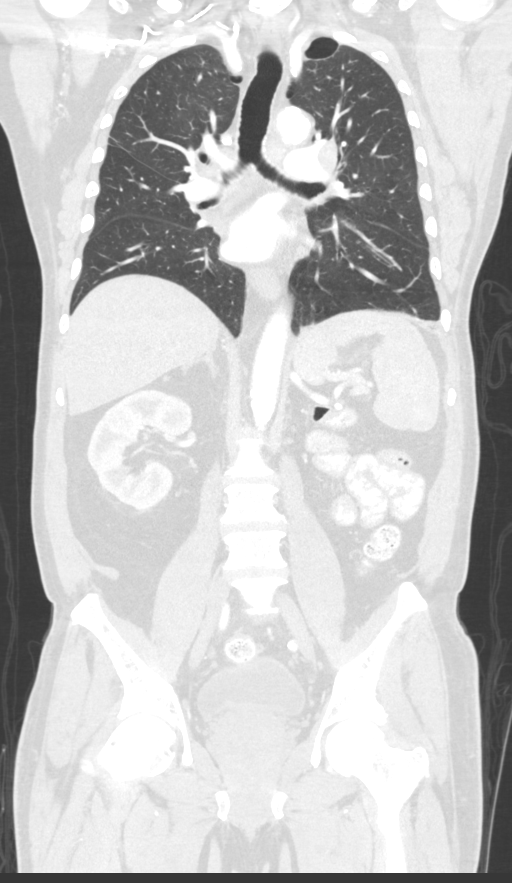

[13 of 36 positions shown; findings below may reference images not displayed]

FINDINGS: CT CHEST FINDINGS

Cardiovascular: Status post CABG. Left-sided subclavian pacemaker
apparatus with intact leads. Extensive atherosclerosis of the aorta,
great vessels and coronary arteries. Normal size cardiac chambers.
No pericardial effusion.

Mediastinum/Nodes: 3.2 x 1.6 x 2.8 cm left hilar lymphadenopathy
with approximately 2 cm left lower paratracheal lymph node and
cm subcarinal short axis lymph node.

Lungs/Pleura: Bilobed right upper lobe pulmonary nodule measuring
approximately 2.3 x 1.3 x 2.2 cm with smaller subcentimeter nodules
ranging in size from 0.3 cm through 0.8 cm also in the right upper
lobe. Paraseptal emphysema in the upper lobes. Bibasilar atelectasis
with minimal bronchiectasis.

Musculoskeletal: No chest wall mass or suspicious bone lesions
identified.

CT ABDOMEN PELVIS FINDINGS

Hepatobiliary: No space-occupying mass or biliary dilatation of the
liver. Status post cholecystectomy.

Pancreas: Normal axial normal

Spleen: Normal

Adrenals/Urinary Tract: Left nephrectomy with compensatory
hypertrophy of the right kidney. 2 x 1.7 cm right adrenal enhancing
nodule. No left-sided retroperitoneal abnormality identified.
Surgical clips in the left renal bed.

Stomach/Bowel: Stomach is within normal limits. Appendix appears
normal. No evidence of bowel wall thickening, distention, or
inflammatory changes.

Vascular/Lymphatic: Aortic atherosclerosis without aneurysm.

Reproductive: Markedly enlarged prostate gland measuring 7.1 cm
transverse by 5.5 cm AP by 6 cm craniocaudad.

Other: No free air free fluid.

Musculoskeletal: Lower lumbar degenerative facet arthropathy.
Osteophytic abnormalities. Mild bilateral femoral head and neck
osteopenia.
IMPRESSION: 1. Pulmonary nodules in the right upper lobe the largest is bilobed
in appearance measuring 2.3 x 1.3 x 2.2 cm concerning for metastatic
disease.
2. Left lower paratracheal and left hilar lymphadenopathy. Findings
are likely metastatic in light of the pulmonary findings and history
of left renal cell carcinoma.
3. Indeterminate 2 cm right adrenal nodule potentially representing
metastatic disease is well.
4. Left nephrectomy.
5. Prostatomegaly with the prostate measuring 7.1 x 5.5 x 6 cm.
6. Cholecystectomy.
7. Aortic atherosclerosis.
